# Patient Record
Sex: Female | Born: 1985 | Race: White | Hispanic: No | Smoking: Never smoker
Health system: Southern US, Community
[De-identification: ages and names within clinical notes are randomized; demographics above are authoritative.]

## PROBLEM LIST (undated history)

## (undated) DIAGNOSIS — Z789 Other specified health status: Secondary | ICD-10-CM

## (undated) HISTORY — PX: NO PAST SURGERIES: SHX2092

## (undated) HISTORY — DX: Other specified health status: Z78.9

---

## 2009-04-28 ENCOUNTER — Other Ambulatory Visit: Admission: RE | Admit: 2009-04-28 | Discharge: 2009-04-28 | Payer: Self-pay | Admitting: Obstetrics and Gynecology

## 2009-10-07 ENCOUNTER — Ambulatory Visit: Payer: Self-pay | Admitting: Advanced Practice Midwife

## 2009-10-07 ENCOUNTER — Inpatient Hospital Stay (HOSPITAL_COMMUNITY): Admission: AD | Admit: 2009-10-07 | Discharge: 2009-10-07 | Payer: Self-pay | Admitting: Obstetrics & Gynecology

## 2009-10-08 ENCOUNTER — Inpatient Hospital Stay (HOSPITAL_COMMUNITY): Admission: AD | Admit: 2009-10-08 | Discharge: 2009-10-11 | Payer: Self-pay | Admitting: Obstetrics & Gynecology

## 2009-10-08 ENCOUNTER — Ambulatory Visit: Payer: Self-pay | Admitting: Advanced Practice Midwife

## 2011-03-18 LAB — CBC
MCHC: 33.6 g/dL (ref 30.0–36.0)
MCHC: 33.6 g/dL (ref 30.0–36.0)
MCV: 91.3 fL (ref 78.0–100.0)
Platelets: 239 10*3/uL (ref 150–400)
Platelets: 299 10*3/uL (ref 150–400)
RBC: 3.66 MIL/uL — ABNORMAL LOW (ref 3.87–5.11)
RDW: 15.9 % — ABNORMAL HIGH (ref 11.5–15.5)

## 2011-03-18 LAB — URINE MICROSCOPIC-ADD ON

## 2011-03-18 LAB — COMPREHENSIVE METABOLIC PANEL
AST: 18 U/L (ref 0–37)
Albumin: 2.9 g/dL — ABNORMAL LOW (ref 3.5–5.2)
Calcium: 8.9 mg/dL (ref 8.4–10.5)
Creatinine, Ser: 0.56 mg/dL (ref 0.4–1.2)
GFR calc Af Amer: >60 mL/min (ref >60)
GFR calc non Af Amer: >60 mL/min (ref >60)

## 2011-03-18 LAB — RPR: RPR Ser Ql: NONREACTIVE

## 2011-03-18 LAB — URINALYSIS, ROUTINE W REFLEX MICROSCOPIC
Leukocytes, UA: NEGATIVE
Specific Gravity, Urine: <1.005 — ABNORMAL LOW (ref 1.005–1.030)
Urobilinogen, UA: 0.2 mg/dL (ref 0.0–1.0)

## 2011-08-31 ENCOUNTER — Other Ambulatory Visit: Payer: Self-pay | Admitting: Adult Health

## 2011-08-31 ENCOUNTER — Other Ambulatory Visit (HOSPITAL_COMMUNITY)
Admission: RE | Admit: 2011-08-31 | Discharge: 2011-08-31 | Disposition: A | Discharge status: Home / Self Care | Payer: Self-pay | Source: Ambulatory Visit | Attending: Obstetrics and Gynecology | Admitting: Obstetrics and Gynecology

## 2011-08-31 DIAGNOSIS — Z01419 Encounter for gynecological examination (general) (routine) without abnormal findings: Secondary | ICD-10-CM | POA: Insufficient documentation

## 2012-12-13 NOTE — L&D Delivery Note (Signed)
Delivery Note At 8:39 AM a viable female was delivered via  (Presentation: ;  ).  APGAR: crying at perineum, ; weight .   Placenta status: intact.  Cord: 3V with the following complications: .  Cord pH: NA  Anesthesia: Epidural  Episiotomy: None Lacerations: Periurethral, and left labial Suture Repair: 4.0 vicryl on SH Est. Blood Loss (mL): 300cc  Mom to postpartum.  Baby to Couplet care / Skin to Skin  NSVD over intact perineum with active management of third stage with pit and traction. Spontaneous delviery of intact placenta w/ 3v cord. Infant delivered to maternal abdomen. Small periurethral tear repaired with 4.0 vicryl on SH - horizontal suture x1. Red robin placed to protect urethra. Small left labial tear. Hemostatic. EBL 300. Counts correct.   Tawana Scale 11/25/2013, 8:57 AM

## 2013-05-24 ENCOUNTER — Encounter: Payer: Self-pay | Admitting: *Deleted

## 2013-05-25 ENCOUNTER — Other Ambulatory Visit (INDEPENDENT_AMBULATORY_CARE_PROVIDER_SITE_OTHER): Payer: BC Managed Care – PPO

## 2013-05-25 ENCOUNTER — Other Ambulatory Visit: Payer: Self-pay | Admitting: Obstetrics & Gynecology

## 2013-05-25 DIAGNOSIS — O3680X0 Pregnancy with inconclusive fetal viability, not applicable or unspecified: Secondary | ICD-10-CM

## 2013-06-04 ENCOUNTER — Other Ambulatory Visit: Payer: Self-pay | Admitting: Women's Health

## 2013-06-04 ENCOUNTER — Encounter: Payer: Self-pay | Admitting: Women's Health

## 2013-06-04 ENCOUNTER — Ambulatory Visit (INDEPENDENT_AMBULATORY_CARE_PROVIDER_SITE_OTHER): Payer: BC Managed Care – PPO | Admitting: Women's Health

## 2013-06-04 ENCOUNTER — Other Ambulatory Visit (HOSPITAL_COMMUNITY)
Admission: RE | Admit: 2013-06-04 | Discharge: 2013-06-04 | Disposition: A | Discharge status: Home / Self Care | Payer: Self-pay | Source: Ambulatory Visit | Attending: Obstetrics & Gynecology | Admitting: Obstetrics & Gynecology

## 2013-06-04 VITALS — BP 116/80 | Ht 65.0 in | Wt 169.5 lb

## 2013-06-04 DIAGNOSIS — Z3482 Encounter for supervision of other normal pregnancy, second trimester: Secondary | ICD-10-CM

## 2013-06-04 DIAGNOSIS — Z01419 Encounter for gynecological examination (general) (routine) without abnormal findings: Secondary | ICD-10-CM | POA: Insufficient documentation

## 2013-06-04 DIAGNOSIS — Z3480 Encounter for supervision of other normal pregnancy, unspecified trimester: Secondary | ICD-10-CM

## 2013-06-04 DIAGNOSIS — Z3481 Encounter for supervision of other normal pregnancy, first trimester: Secondary | ICD-10-CM

## 2013-06-04 DIAGNOSIS — Z113 Encounter for screening for infections with a predominantly sexual mode of transmission: Secondary | ICD-10-CM | POA: Insufficient documentation

## 2013-06-04 DIAGNOSIS — Z331 Pregnant state, incidental: Secondary | ICD-10-CM

## 2013-06-04 DIAGNOSIS — Z1389 Encounter for screening for other disorder: Secondary | ICD-10-CM

## 2013-06-04 LAB — HIV ANTIBODY (ROUTINE TESTING W REFLEX): HIV: NONREACTIVE

## 2013-06-04 LAB — CBC
HCT: 35 % — ABNORMAL LOW (ref 36.0–46.0)
MCV: 88.6 fL (ref 78.0–100.0)
Platelets: 343 10*3/uL (ref 150–400)
RBC: 3.95 MIL/uL (ref 3.87–5.11)
WBC: 5.5 10*3/uL (ref 4.0–10.5)

## 2013-06-04 LAB — POCT URINALYSIS DIPSTICK

## 2013-06-04 NOTE — Progress Notes (Signed)
  Subjective:    Kayla Fernandez is a 27 y.o. G43P1001 Caucasian female at [redacted]w[redacted]d by 13wk u/s, being seen today for her first obstetrical visit.  Her obstetrical history is significant for uncomplicated term SVD x 1.  Pregnancy history fully reviewed.   Patient reports occasional nausea, no vomiting. Denies vb, cramping, UTI s/s.   Filed Vitals:   06/04/13 1102  BP: 116/80  Weight: 169 lb 8 oz (76.885 kg)    HISTORY: OB History   Grav Para Term Preterm Abortions TAB SAB Ect Mult Living   2 1 1       1      # Outc Date GA Lbr Len/2nd Wgt Sex Del Anes PTL Lv   1 TRM 10/10 [redacted]w[redacted]d  6lb11oz(3.033kg) M SVD EPI  Yes   2 CUR              Past Medical History  Diagnosis Date  . Medical history non-contributory    Past Surgical History  Procedure Laterality Date  . No past surgeries     Family History  Problem Relation Age of Onset  . Stroke Other   . Birth defects Paternal Grandmother     ovarian      Exam    Pelvic Exam:    Perineum: Normal Perineum   Vulva: normal   Vagina:  normal mucosa, normal discharge, no palpable nodules   Uterus   FH u-2     Cervix: normal   Adnexa: Not palpable   Urinary: urethral meatus normal    System:     Skin: normal coloration and turgor, no rashes    Neurologic: oriented, normal mood   Extremities: normal strength, tone, and muscle mass   HEENT PERRLA   Mouth/Teeth mucous membranes moist   Cardiovascular: regular rate and rhythm   Respiratory:  appears well, vitals normal, no respiratory distress, acyanotic, normal RR   Abdomen: soft, non-tender    Thin prep pap smear obtained  FHR 140 via doppler   Assessment:    Pregnancy: G2P1001 There are no active problems to display for this patient.     [redacted]w[redacted]d G2P1001 New OB visit Nausea    Plan:     Initial labs drawn Continue prenatal vitamins Problem list reviewed and updated Reviewed n/v relief measures and warning s/s to report Reviewed recommended weight gain based  on pre-gravid BMI Encouraged well-balanced diet Genetic Screening discussed Quad Screen: ordered Cystic fibrosis screening discussed requested Ultrasound discussed; fetal survey: requested Follow up in 4 weeks for anatomy u/s and visit  Marge Duncans 06/04/2013 11:36 AM

## 2013-06-04 NOTE — Progress Notes (Signed)
Cramps in stomach last week. None since then. New OB packet given. Consents signed.

## 2013-06-04 NOTE — Patient Instructions (Signed)
Nausea & Vomiting  Have saltine crackers or pretzels by your bed and eat a few bites before you raise your head out of bed in the morning  Eat small frequent meals throughout the day instead of large meals  Drink plenty of fluids throughout the day to stay hydrated, just don't drink a lot of fluids with your meals.  This can make your stomach fill up faster making you feel sick  Do not brush your teeth right after you eat  Products with real ginger are good for nausea, like ginger ale and ginger hard candy Make sure it says made with real ginger!  Sucking on sour candy like lemon heads is also good for nausea  If your prenatal vitamins make you nauseated, take them at night so you will sleep through the nausea  If you feel like you need medicine for the nausea & vomiting please let us know  If you are unable to keep any fluids or food down please let us know    Pregnancy - Second Trimester The second trimester of pregnancy (3 to 6 months) is a period of rapid growth for you and your baby. At the end of the sixth month, your baby is about 9 inches long and weighs 1 1/2 pounds. You will begin to feel the baby move between 18 and 20 weeks of the pregnancy. This is called quickening. Weight gain is faster. A clear fluid (colostrum) may leak out of your breasts. You may feel small contractions of the womb (uterus). This is known as false labor or Braxton-Hicks contractions. This is like a practice for labor when the baby is ready to be born. Usually, the problems with morning sickness have usually passed by the end of your first trimester. Some women develop small dark blotches (called cholasma, mask of pregnancy) on their face that usually goes away after the baby is born. Exposure to the sun makes the blotches worse. Acne may also develop in some pregnant women and pregnant women who have acne, may find that it goes away. PRENATAL EXAMS  Blood work may continue to be done during prenatal exams.  These tests are done to check on your health and the probable health of your baby. Blood work is used to follow your blood levels (hemoglobin). Anemia (low hemoglobin) is common during pregnancy. Iron and vitamins are given to help prevent this. You will also be checked for diabetes between 24 and 28 weeks of the pregnancy. Some of the previous blood tests may be repeated.  The size of the uterus is measured during each visit. This is to make sure that the baby is continuing to grow properly according to the dates of the pregnancy.  Your blood pressure is checked every prenatal visit. This is to make sure you are not getting toxemia.  Your urine is checked to make sure you do not have an infection, diabetes or protein in the urine.  Your weight is checked often to make sure gains are happening at the suggested rate. This is to ensure that both you and your baby are growing normally.  Sometimes, an ultrasound is performed to confirm the proper growth and development of the baby. This is a test which bounces harmless sound waves off the baby so your caregiver can more accurately determine due dates. Sometimes, a test is done on the amniotic fluid surrounding the baby. This test is called an amniocentesis. The amniotic fluid is obtained by sticking a needle into the belly (abdomen).   This is done to check the chromosomes in instances where there is a concern about possible genetic problems with the baby. It is also sometimes done near the end of pregnancy if an early delivery is required. In this case, it is done to help make sure the baby's lungs are mature enough for the baby to live outside of the womb. CHANGES OCCURING IN THE SECOND TRIMESTER OF PREGNANCY Your body goes through many changes during pregnancy. They vary from person to person. Talk to your caregiver about changes you notice that you are concerned about.  During the second trimester, you will likely have an increase in your appetite. It  is normal to have cravings for certain foods. This varies from person to person and pregnancy to pregnancy.  Your lower abdomen will begin to bulge.  You may have to urinate more often because the uterus and baby are pressing on your bladder. It is also common to get more bladder infections during pregnancy. You can help this by drinking lots of fluids and emptying your bladder before and after intercourse.  You may begin to get stretch marks on your hips, abdomen, and breasts. These are normal changes in the body during pregnancy. There are no exercises or medicines to take that prevent this change.  You may begin to develop swollen and bulging veins (varicose veins) in your legs. Wearing support hose, elevating your feet for 15 minutes, 3 to 4 times a day and limiting salt in your diet helps lessen the problem.  Heartburn may develop as the uterus grows and pushes up against the stomach. Antacids recommended by your caregiver helps with this problem. Also, eating smaller meals 4 to 5 times a day helps.  Constipation can be treated with a stool softener or adding bulk to your diet. Drinking lots of fluids, and eating vegetables, fruits, and whole grains are helpful.  Exercising is also helpful. If you have been very active up until your pregnancy, most of these activities can be continued during your pregnancy. If you have been less active, it is helpful to start an exercise program such as walking.  Hemorrhoids may develop at the end of the second trimester. Warm sitz baths and hemorrhoid cream recommended by your caregiver helps hemorrhoid problems.  Backaches may develop during this time of your pregnancy. Avoid heavy lifting, wear low heal shoes, and practice good posture to help with backache problems.  Some pregnant women develop tingling and numbness of their hand and fingers because of swelling and tightening of ligaments in the wrist (carpel tunnel syndrome). This goes away after the  baby is born.  As your breasts enlarge, you may have to get a bigger bra. Get a comfortable, cotton, support bra. Do not get a nursing bra until the last month of the pregnancy if you will be nursing the baby.  You may get a dark line from your belly button to the pubic area called the linea nigra.  You may develop rosy cheeks because of increase blood flow to the face.  You may develop spider looking lines of the face, neck, arms, and chest. These go away after the baby is born. HOME CARE INSTRUCTIONS   It is extremely important to avoid all smoking, herbs, alcohol, and unprescribed drugs during your pregnancy. These chemicals affect the formation and growth of the baby. Avoid these chemicals throughout the pregnancy to ensure the delivery of a healthy infant.  Most of your home care instructions are the same as suggested   for the first trimester of your pregnancy. Keep your caregiver's appointments. Follow your caregiver's instructions regarding medicine use, exercise, and diet.  During pregnancy, you are providing food for you and your baby. Continue to eat regular, well-balanced meals. Choose foods such as meat, fish, milk and other low fat dairy products, vegetables, fruits, and whole-grain breads and cereals. Your caregiver will tell you of the ideal weight gain.  A physical sexual relationship may be continued up until near the end of pregnancy if there are no other problems. Problems could include early (premature) leaking of amniotic fluid from the membranes, vaginal bleeding, abdominal pain, or other medical or pregnancy problems.  Exercise regularly if there are no restrictions. Check with your caregiver if you are unsure of the safety of some of your exercises. The greatest weight gain will occur in the last 2 trimesters of pregnancy. Exercise will help you:  Control your weight.  Get you in shape for labor and delivery.  Lose weight after you have the baby.  Wear a good support  or jogging bra for breast tenderness during pregnancy. This may help if worn during sleep. Pads or tissues may be used in the bra if you are leaking colostrum.  Do not use hot tubs, steam rooms or saunas throughout the pregnancy.  Wear your seat belt at all times when driving. This protects you and your baby if you are in an accident.  Avoid raw meat, uncooked cheese, cat litter boxes, and soil used by cats. These carry germs that can cause birth defects in the baby.  The second trimester is also a good time to visit your dentist for your dental health if this has not been done yet. Getting your teeth cleaned is okay. Use a soft toothbrush. Brush gently during pregnancy.  It is easier to leak urine during pregnancy. Tightening up and strengthening the pelvic muscles will help with this problem. Practice stopping your urination while you are going to the bathroom. These are the same muscles you need to strengthen. It is also the muscles you would use as if you were trying to stop from passing gas. You can practice tightening these muscles up 10 times a set and repeating this about 3 times per day. Once you know what muscles to tighten up, do not perform these exercises during urination. It is more likely to contribute to an infection by backing up the urine.  Ask for help if you have financial, counseling, or nutritional needs during pregnancy. Your caregiver will be able to offer counseling for these needs as well as refer you for other special needs.  Your skin may become oily. If so, wash your face with mild soap, use non-greasy moisturizer and oil or cream based makeup. MEDICINES AND DRUG USE IN PREGNANCY  Take prenatal vitamins as directed. The vitamin should contain 1 milligram of folic acid. Keep all vitamins out of reach of children. Only a couple vitamins or tablets containing iron may be fatal to a baby or young child when ingested.  Avoid use of all medicines, including herbs,  over-the-counter medicines, not prescribed or suggested by your caregiver. Only take over-the-counter or prescription medicines for pain, discomfort, or fever as directed by your caregiver. Do not use aspirin.  Let your caregiver also know about herbs you may be using.  Alcohol is related to a number of birth defects. This includes fetal alcohol syndrome. All alcohol, in any form, should be avoided completely. Smoking will cause low birth rate and   premature babies.  Street or illegal drugs are very harmful to the baby. They are absolutely forbidden. A baby born to an addicted mother will be addicted at birth. The baby will go through the same withdrawal an adult does. SEEK MEDICAL CARE IF:  You have any concerns or worries during your pregnancy. It is better to call with your questions if you feel they cannot wait, rather than worry about them. SEEK IMMEDIATE MEDICAL CARE IF:   An unexplained oral temperature above 102 F (38.9 C) develops, or as your caregiver suggests.  You have leaking of fluid from the vagina (birth canal). If leaking membranes are suspected, take your temperature and tell your caregiver of this when you call.  There is vaginal spotting, bleeding, or passing clots. Tell your caregiver of the amount and how many pads are used. Light spotting in pregnancy is common, especially following intercourse.  You develop a bad smelling vaginal discharge with a change in the color from clear to white.  You continue to feel sick to your stomach (nauseated) and have no relief from remedies suggested. You vomit blood or coffee ground-like materials.  You lose more than 2 pounds of weight or gain more than 2 pounds of weight over 1 week, or as suggested by your caregiver.  You notice swelling of your face, hands, feet, or legs.  You get exposed to German measles and have never had them.  You are exposed to fifth disease or chickenpox.  You develop belly (abdominal) pain. Round  ligament discomfort is a common non-cancerous (benign) cause of abdominal pain in pregnancy. Your caregiver still must evaluate you.  You develop a bad headache that does not go away.  You develop fever, diarrhea, pain with urination, or shortness of breath.  You develop visual problems, blurry, or double vision.  You fall or are in a car accident or any kind of trauma.  There is mental or physical violence at home. Document Released: 11/23/2001 Document Revised: 08/23/2012 Document Reviewed: 05/28/2009 [employer] Patient Information 2014 [employer].  

## 2013-06-05 LAB — AFP, QUAD SCREEN
AFP: 32.1 IU/mL
Curr Gest Age: 15.1 wks.days
Interpretation-AFP: NEGATIVE
Osb Risk: 1:5390 {titer}
uE3 Mom: 1.49
uE3 Value: 0.6 ng/mL

## 2013-06-05 LAB — VARICELLA ZOSTER ANTIBODY, IGG: Varicella IgG: 528.4 Index — ABNORMAL HIGH (ref <135.00)

## 2013-06-05 LAB — ANTIBODY SCREEN: Antibody Screen: NEGATIVE

## 2013-06-05 LAB — URINALYSIS
Glucose, UA: NEGATIVE mg/dL
Specific Gravity, Urine: 1.026 (ref 1.005–1.030)
pH: 6 (ref 5.0–8.0)

## 2013-06-05 LAB — DRUG SCREEN, URINE, NO CONFIRMATION
Cocaine Metabolites: NEGATIVE
Creatinine,U: 275.4 mg/dL
Opiate Screen, Urine: NEGATIVE
Phencyclidine (PCP): NEGATIVE

## 2013-06-05 LAB — URINE CULTURE
Colony Count: NO GROWTH
Organism ID, Bacteria: NO GROWTH

## 2013-06-06 ENCOUNTER — Encounter: Payer: Self-pay | Admitting: Women's Health

## 2013-06-06 LAB — CYSTIC FIBROSIS DIAGNOSTIC STUDY

## 2013-06-09 ENCOUNTER — Encounter: Payer: Self-pay | Admitting: Women's Health

## 2013-07-02 ENCOUNTER — Encounter: Payer: Self-pay | Admitting: Women's Health

## 2013-07-02 ENCOUNTER — Other Ambulatory Visit: Payer: Self-pay | Admitting: Obstetrics & Gynecology

## 2013-07-02 ENCOUNTER — Ambulatory Visit (INDEPENDENT_AMBULATORY_CARE_PROVIDER_SITE_OTHER): Payer: BC Managed Care – PPO | Admitting: Women's Health

## 2013-07-02 ENCOUNTER — Ambulatory Visit (INDEPENDENT_AMBULATORY_CARE_PROVIDER_SITE_OTHER): Payer: BC Managed Care – PPO

## 2013-07-02 VITALS — BP 120/78 | Wt 171.0 lb

## 2013-07-02 DIAGNOSIS — Z3482 Encounter for supervision of other normal pregnancy, second trimester: Secondary | ICD-10-CM

## 2013-07-02 DIAGNOSIS — Z1389 Encounter for screening for other disorder: Secondary | ICD-10-CM

## 2013-07-02 DIAGNOSIS — Z3480 Encounter for supervision of other normal pregnancy, unspecified trimester: Secondary | ICD-10-CM

## 2013-07-02 DIAGNOSIS — Z331 Pregnant state, incidental: Secondary | ICD-10-CM

## 2013-07-02 LAB — POCT URINALYSIS DIPSTICK
Blood, UA: NEGATIVE
Glucose, UA: NEGATIVE
Ketones, UA: NEGATIVE

## 2013-07-02 NOTE — Progress Notes (Signed)
Reports flutters. Denies uc's, lof, vb, urinary frequency, urgency, hesitancy, or dysuria.  Heartburn, hasn't tried any OTC measures, to try TUMS.  Reviewed warning s/s to report.  All questions answered. F/U in 4wks for visit.

## 2013-07-02 NOTE — Progress Notes (Signed)
Having heartburn. Recommended TUMS.

## 2013-07-02 NOTE — Patient Instructions (Signed)
Sterilization Information, Female Female sterilization is a procedure to permanently prevent pregnancy. There are different ways to perform sterilization, but all either block or close the fallopian tubes so that your eggs cannot reach your uterus. If your egg cannot reach your uterus, sperm cannot fertilize the egg, and you cannot get pregnant.  Sterilization is performed by a surgical procedure. Sometimes these procedures are performed in a hospital while a patient is asleep. Sometimes they can be done in a clinic setting with the patient awake. The fallopian tubes can be surgically cut, tied, or sealed through a procedure called tubal ligation. The fallopian tubes can also be closed with clips or rings. Sterilization can also be done by placing a tiny coil into each fallopian tube, which causes scar tissue to grow inside the tube. The scar tissue then blocks the tubes.  Discuss sterilization with your caregiver to answer any concerns you or your partner may have. You may want to ask what type of sterilization your caregiver performs. Some caregivers may not perform all the various options. Sterilization is permanent and should only be done if you are sure you do not want children or do not want any more children. Having a sterilization reversed may not be successful.  STERILIZATION PROCEDURES  Laparoscopic sterilization. This is a surgical method performed at a time other than right after childbirth. Two incisions are made in the lower abdomen. A thin, lighted tube (laparoscope) is inserted into one of the incisions and is used to perform the procedure. The fallopian tubes are closed with a ring or a clip. An instrument that uses heat could be used to seal the tubes closed (electrocautery).   Mini-laparotomy. This is a surgical method done 1 or 2 days after giving birth. Typically, a small incision is made just below the belly button (umbilicus) and the fallopian tubes are exposed. The tubes can then be  sealed, tied, or cut.   Hysteroscopic sterilization. This is performed at a time other than right after childbirth. A tiny, spring-like coil is inserted through the cervix and uterus and placed into the fallopian tubes. The coil causes scaring and blocks the tubes. Other forms of contraception should be used for 3 months after the procedure to allow the scar tissue to form completely. Additionally, it is required hysterosalpingography be done 3 months later to ensure that the procedure was successful. Hysterosalpingography is a procedure that uses X-rays to look at your uterus and fallopian tubes after a material to make them show up better has been inserted. IS STERILIZATION SAFE? Sterilization is considered safe with very rare complications. Risks depend on the type of procedure you have. As with any surgical procedure, there are risks. Some risks of sterilization by any means include:   Bleeding.  Infection.  Reaction to anesthesia medicine.  Injury to surrounding organs. Risks specific to having hysteroscopic coils placed include:  The coils may not be placed correctly the first time.   The coils may move out of place.   The tubes may not get completely blocked after 3 months.   Injury to surrounding organs when placing the coil.  HOW EFFECTIVE IS FEMALE STERILIZATION? Sterilization is nearly 100% effective, but it can fail. Depending on the type of sterilization, the rate of failure can be as high as 3%. After hysteroscopic sterilization with placement of fallopian tube coils, you will need back-up birth control for 3 months after the procedure. Sterilization is effective for a lifetime.  BENEFITS OF STERILIZATION  It does   not affect your hormones, and therefore will not affect your menstrual periods, sexual desire, or performance.   It is effective for a lifetime.   It is safe.   You do not need to worry about getting pregnant. Keep in mind that if you had the  hysteroscopic placement procedure, you must wait 3 months after the procedure (or until your caregiver confirms) before pregnancy is not considered possible.   There are no side effects unlike other types of birth control (contraception).  DRAWBACKS OF STERILIZATION  You must be sure you do not want children or any more children. The procedure is permanent.   It does not provide protection against sexually transmitted infections (STIs).   The tubes can grow back together. If this happens, there is a risk of pregnancy. There is also an increased risk (50%) of pregnancy being an ectopic pregnancy. This is a pregnancy that happens outside of the uterus. Document Released: 05/17/2008 Document Revised: 05/30/2012 Document Reviewed: 03/16/2012 [employer] Patient Information 2014 [employer].  

## 2013-07-02 NOTE — Progress Notes (Signed)
Anat Screen complete, all anat appears nml, meas C/W dates, 18+5 weeks, EDC 01/26/2013, cx closed=4.7 cm, bilat ovs seen, fluid nml, active female fetus, FHR 143 bpm, post. Plac.vertex lie

## 2013-07-03 ENCOUNTER — Telehealth: Payer: Self-pay | Admitting: Women's Health

## 2013-07-03 NOTE — Telephone Encounter (Signed)
Spoke with pt. Advised to watch for any symptoms, blisters, and let us know if she gets any. Advised there is a blood test that can be done. Pt voiced understanding. JSY

## 2013-07-30 ENCOUNTER — Encounter: Payer: Self-pay | Admitting: Women's Health

## 2013-07-30 ENCOUNTER — Ambulatory Visit (INDEPENDENT_AMBULATORY_CARE_PROVIDER_SITE_OTHER): Payer: BC Managed Care – PPO | Admitting: Obstetrics and Gynecology

## 2013-07-30 VITALS — BP 112/82 | Wt 176.5 lb

## 2013-07-30 DIAGNOSIS — Z3480 Encounter for supervision of other normal pregnancy, unspecified trimester: Secondary | ICD-10-CM

## 2013-07-30 DIAGNOSIS — Z3482 Encounter for supervision of other normal pregnancy, second trimester: Secondary | ICD-10-CM

## 2013-07-30 DIAGNOSIS — Z1389 Encounter for screening for other disorder: Secondary | ICD-10-CM

## 2013-07-30 DIAGNOSIS — Z331 Pregnant state, incidental: Secondary | ICD-10-CM

## 2013-07-30 LAB — POCT URINALYSIS DIPSTICK
Glucose, UA: NEGATIVE
Ketones, UA: NEGATIVE

## 2013-07-30 NOTE — Progress Notes (Signed)
Heartburn improved, no medical tx required. Good FM, NO c/o

## 2013-08-27 ENCOUNTER — Ambulatory Visit (INDEPENDENT_AMBULATORY_CARE_PROVIDER_SITE_OTHER): Payer: BC Managed Care – PPO | Admitting: Women's Health

## 2013-08-27 ENCOUNTER — Other Ambulatory Visit: Payer: BC Managed Care – PPO

## 2013-08-27 ENCOUNTER — Encounter: Payer: Self-pay | Admitting: Women's Health

## 2013-08-27 VITALS — BP 100/70 | Wt 178.0 lb

## 2013-08-27 DIAGNOSIS — Z331 Pregnant state, incidental: Secondary | ICD-10-CM

## 2013-08-27 DIAGNOSIS — Z3483 Encounter for supervision of other normal pregnancy, third trimester: Secondary | ICD-10-CM

## 2013-08-27 DIAGNOSIS — Z3480 Encounter for supervision of other normal pregnancy, unspecified trimester: Secondary | ICD-10-CM

## 2013-08-27 DIAGNOSIS — Z1389 Encounter for screening for other disorder: Secondary | ICD-10-CM

## 2013-08-27 LAB — CBC
HCT: 35.2 % — ABNORMAL LOW (ref 36.0–46.0)
MCV: 92.9 fL (ref 78.0–100.0)
Platelets: 334 10*3/uL (ref 150–400)
RBC: 3.79 MIL/uL — ABNORMAL LOW (ref 3.87–5.11)
WBC: 7.4 10*3/uL (ref 4.0–10.5)

## 2013-08-27 LAB — POCT URINALYSIS DIPSTICK
Blood, UA: NEGATIVE
Nitrite, UA: NEGATIVE

## 2013-08-27 LAB — RPR

## 2013-08-27 NOTE — Patient Instructions (Signed)

## 2013-08-27 NOTE — Progress Notes (Signed)
Reports good fm. Denies uc's, lof, vb, urinary frequency, urgency, hesitancy, or dysuria.  No complaints.  Reviewed ptl s/s, fkc.  All questions answered. PN2 today. F/U in 4wks for visit.

## 2013-08-27 NOTE — Progress Notes (Signed)
For PN2 Today. 

## 2013-08-28 ENCOUNTER — Telehealth: Payer: Self-pay | Admitting: Obstetrics and Gynecology

## 2013-08-28 ENCOUNTER — Encounter: Payer: Self-pay | Admitting: Women's Health

## 2013-08-28 LAB — GLUCOSE TOLERANCE, 2 HOURS W/ 1HR
Glucose, 1 hour: 102 mg/dL (ref 70–170)
Glucose, Fasting: 67 mg/dL — ABNORMAL LOW (ref 70–99)

## 2013-08-28 LAB — ANTIBODY SCREEN: Antibody Screen: NEGATIVE

## 2013-08-28 LAB — HSV 2 ANTIBODY, IGG: HSV 2 Glycoprotein G Ab, IgG: <0.1 IV

## 2013-08-28 NOTE — Telephone Encounter (Signed)
Pt states noticed clear, thick discharge today. No bleeding, odor, irritation,or cramping, +FM. Pt states was seen in our office yesterday. Per Joellyn Haff, CNM everything WNL. Pt to monitor discharge if odor, irritation, or watery discharge, vaginal bleeding, or decrease FM pt to call our office back. Pt verbalized understanding.

## 2013-08-30 ENCOUNTER — Telehealth: Payer: Self-pay | Admitting: Obstetrics & Gynecology

## 2013-08-30 NOTE — Telephone Encounter (Signed)
Pt states concerned discharge is amniotic fluid, states at times discharge is thick and other times it is watery. Pt states +FM, no vaginal bleeding. Pt states going out of town this weekend and would like to be seen. Call transferred to front staff for an appt to be made.

## 2013-08-31 ENCOUNTER — Ambulatory Visit (INDEPENDENT_AMBULATORY_CARE_PROVIDER_SITE_OTHER): Payer: BC Managed Care – PPO | Admitting: Obstetrics and Gynecology

## 2013-08-31 ENCOUNTER — Encounter: Payer: Self-pay | Admitting: Obstetrics and Gynecology

## 2013-08-31 VITALS — BP 110/70 | Wt 179.0 lb

## 2013-08-31 DIAGNOSIS — Z3483 Encounter for supervision of other normal pregnancy, third trimester: Secondary | ICD-10-CM

## 2013-08-31 DIAGNOSIS — Z1389 Encounter for screening for other disorder: Secondary | ICD-10-CM

## 2013-08-31 DIAGNOSIS — O2390 Unspecified genitourinary tract infection in pregnancy, unspecified trimester: Secondary | ICD-10-CM

## 2013-08-31 DIAGNOSIS — N898 Other specified noninflammatory disorders of vagina: Secondary | ICD-10-CM

## 2013-08-31 DIAGNOSIS — O99019 Anemia complicating pregnancy, unspecified trimester: Secondary | ICD-10-CM

## 2013-08-31 LAB — POCT URINALYSIS DIPSTICK
Blood, UA: NEGATIVE
Glucose, UA: NEGATIVE
Ketones, UA: NEGATIVE
Leukocytes, UA: NEGATIVE
Nitrite, UA: NEGATIVE
Protein, UA: NEGATIVE

## 2013-08-31 NOTE — Patient Instructions (Addendum)
Continue routine care Preterm Labor Preterm labor is when labor starts at less than 37 weeks of pregnancy. The normal length of a pregnancy is 39 to 41 weeks. CAUSES Often, there is no identifiable underlying cause as to why a woman goes into preterm labor. However, one of the most common known causes of preterm labor is infection. Infections of the uterus, cervix, vagina, amniotic sac, bladder, kidney, or even the lungs (pneumonia) can cause labor to start. Other causes of preterm labor include:  Urogenital infections, such as yeast infections and bacterial vaginosis.  Uterine abnormalities (uterine shape, uterine septum, fibroids, bleeding from the placenta).  A cervix that has been operated on and opens prematurely.  Malformations in the baby.  Multiple gestations (twins, triplets, and so on).  Breakage of the amniotic sac. Additional risk factors for preterm labor include:  Previous history of preterm labor.  Premature rupture of membranes (PROM).  A placenta that covers the opening of the cervix (placenta previa).  A placenta that separates from the uterus (placenta abruption).  A cervix that is too weak to hold the baby in the uterus (incompetence cervix).  Having too much fluid in the amniotic sac (polyhydramnios).  Taking illegal drugs or smoking while pregnant.  Not gaining enough weight while pregnant.  Women younger than 76 and older than 27 years old.  Low socioeconomic status.  African-American ethnicity. SYMPTOMS Signs and symptoms of preterm labor include:  Menstrual-like cramps.  Contractions that are 30 to 70 seconds apart, become very regular, closer together, and are more intense and painful.  Contractions that start on the top of the uterus and spread down to the lower abdomen and back.  A sense of increased pelvic pressure or back pain.  A watery or bloody discharge that comes from the vagina. DIAGNOSIS  A diagnosis can be confirmed by:  A  vaginal exam.  An ultrasound of the cervix.  Sampling (swabbing) cervico-vaginal secretions. These samples can be tested for the presence of fetal fibronectin. This is a protein found in cervical discharge which is associated with preterm labor.  Fetal monitoring. TREATMENT  Depending on the length of the pregnancy and other circumstances, a caregiver may suggest bed rest. If necessary, there are medicines that can be given to stop contractions and to quicken fetal lung maturity. If labor happens before 34 weeks of pregnancy, a prolonged hospital stay may be recommended. Treatment depends on the condition of both the mother and baby. PREVENTION There are some things a mother can do to lower the risk of preterm labor in future pregnancies. A woman can:   Stop smoking.  Maintain healthy weight gain and avoid chemicals and drugs that are not necessary.  Be watchful for any type of infection.  Inform her caregiver if she has a known history of preterm labor. Document Released: 02/19/2004 Document Revised: 02/21/2012 Document Reviewed: 03/26/2011 [facility] Patient Information 2014 [place], [place].

## 2013-08-31 NOTE — Progress Notes (Signed)
Discharge appears physiologic wet prep negative

## 2013-08-31 NOTE — Progress Notes (Signed)
Pt here today for ? Leaking of fluid. Pt states she has an abnormal fluid that comes and goes during the day, sometimes the discharge is thick and others times its not. No other problems or concerns at this time.

## 2013-09-17 ENCOUNTER — Ambulatory Visit (INDEPENDENT_AMBULATORY_CARE_PROVIDER_SITE_OTHER): Payer: BC Managed Care – PPO | Admitting: Obstetrics and Gynecology

## 2013-09-17 VITALS — BP 120/68 | Wt 180.2 lb

## 2013-09-17 DIAGNOSIS — Z331 Pregnant state, incidental: Secondary | ICD-10-CM

## 2013-09-17 DIAGNOSIS — Z3483 Encounter for supervision of other normal pregnancy, third trimester: Secondary | ICD-10-CM

## 2013-09-17 DIAGNOSIS — Z3480 Encounter for supervision of other normal pregnancy, unspecified trimester: Secondary | ICD-10-CM

## 2013-09-17 DIAGNOSIS — Z1389 Encounter for screening for other disorder: Secondary | ICD-10-CM

## 2013-09-17 LAB — POCT URINALYSIS DIPSTICK
Blood, UA: NEGATIVE
Nitrite, UA: NEGATIVE

## 2013-09-17 NOTE — Patient Instructions (Addendum)
Heartburn During Pregnancy  Heartburn is a burning sensation in the chest caused by stomach acid backing up into the esophagus. Heartburn (also known as "reflux") is common in pregnancy because a certain hormone (progesterone) changes. The progesterone hormone may relax the valve that separates the esophagus from the stomach. This allows acid to go up into the esophagus, causing heartburn. Heartburn may also happen in pregnancy because the enlarging uterus pushes up on the stomach, which pushes more acid into the esophagus. This is especially true in the later stages of pregnancy. Heartburn problems usually go away after giving birth. CAUSES   The progesterone hormone.  Changing hormone levels.  The growing uterus that pushes stomach acid upward.  Large meals.  Certain foods and drinks.  Exercise.  Increased acid production. SYMPTOMS   Burning pain in the chest or lower throat.  Bitter taste in the mouth.  Coughing. DIAGNOSIS  Heartburn is typically diagnosed by your caregiver when taking a careful history of your concern. Your caregiver may order a blood test to check for a certain type of bacteria that is associated with heartburn. Sometimes, heartburn is diagnosed by prescribing a heartburn medicine to see if the symptoms improve. It is rare in pregnancy to have a procedure called an endoscopy. This is when a tube with a light and a camera on the end is used to examine the esophagus and the stomach. TREATMENT   Your caregiver may tell you to use certain over-the-counter medicines (antacids, acid reducers) for mild heartburn.  Your caregiver may prescribe medicines to decrease stomach acid or to protect your stomach lining.  Your caregiver may recommend certain diet changes.  For severe cases, your caregiver may recommend that the head of the bed be elevated on blocks. (Sleeping with more pillows is not an effective treatment as it only changes the position of your head and does  not improve the main problem of stomach acid refluxing into the esophagus.) HOME CARE INSTRUCTIONS   Take all medicines as directed by your caregiver.  Raise the head of your bed by putting blocks under the legs if instructed to by your caregiver.  Do not exercise right after eating.  Avoid eating 2 or 3 hours before bed. Do not lie down right after eating.  Eat small meals throughout the day instead of 3 large meals.  Identify foods and beverages that make your symptoms worse and avoid them. Foods you may want to avoid include:  Peppers.  Chocolate.  High-fat foods, including fried foods.  Spicy foods.  Garlic and onions.  Citrus fruits, including oranges, grapefruit, lemons, and limes.  Food containing tomatoes or tomato products.  Mint.  Carbonated and caffeinated drinks.  Vinegar. SEEK IMMEDIATE MEDICAL CARE IF:   You have severe chest pain that goes down your arm or into your jaw or neck.  You feel sweaty, dizzy, or lightheaded.  You become short of breath.  You vomit blood.  You have difficulty or pain with swallowing.  You have bloody or black, tarry stools.  You have episodes of heartburn more than 3 times a week, for more than 2 weeks. MAKE SURE YOU:  Understand these instructions.  Will watch your condition.  Will get help right away if you are not doing well or get worse. Document Released: 11/26/2000 Document Revised: 02/21/2012 Document Reviewed: 05/20/2011 [facility] Patient Information 2014 [place], [place]. Fetal Movement Counts Patient Name: __________________________________________________ Patient Due Date: ____________________ Performing a fetal movement count is highly recommended in high-risk pregnancies, but  it is good for every pregnant woman to do. Your caregiver may ask you to start counting fetal movements at 28 weeks of the pregnancy. Fetal movements often increase:  After eating a full meal.  After physical activity.  After  eating or drinking something sweet or cold.  At rest. Pay attention to when you feel the baby is most active. This will help you notice a pattern of your baby's sleep and wake cycles and what factors contribute to an increase in fetal movement. It is important to perform a fetal movement count at the same time each day when your baby is normally most active.  HOW TO COUNT FETAL MOVEMENTS 1. Find a quiet and comfortable area to sit or lie down on your left side. Lying on your left side provides the best blood and oxygen circulation to your baby. 2. Write down the day and time on a sheet of paper or in a journal. 3. Start counting kicks, flutters, swishes, rolls, or jabs in a 2 hour period. You should feel at least 10 movements within 2 hours. 4. If you do not feel 10 movements in 2 hours, wait 2 3 hours and count again. Look for a change in the pattern or not enough counts in 2 hours. SEEK MEDICAL CARE IF:  You feel less than 10 counts in 2 hours, tried twice.  There is no movement in over an hour.  The pattern is changing or taking longer each day to reach 10 counts in 2 hours.  You feel the baby is not moving as he or she usually does. Date: ____________ Movements: ____________ Start time: ____________ Kayla Fernandez time: ____________  Date: ____________ Movements: ____________ Start time: ____________ Kayla Fernandez time: ____________ Date: ____________ Movements: ____________ Start time: ____________ Kayla Fernandez time: ____________ Date: ____________ Movements: ____________ Start time: ____________ Kayla Fernandez time: ____________ Date: ____________ Movements: ____________ Start time: ____________ Kayla Fernandez time: ____________ Date: ____________ Movements: ____________ Start time: ____________ Kayla Fernandez time: ____________ Date: ____________ Movements: ____________ Start time: ____________ Kayla Fernandez time: ____________ Date: ____________ Movements: ____________ Start time: ____________ Kayla Fernandez time: ____________  Date:  ____________ Movements: ____________ Start time: ____________ Kayla Fernandez time: ____________ Date: ____________ Movements: ____________ Start time: ____________ Kayla Fernandez time: ____________ Date: ____________ Movements: ____________ Start time: ____________ Kayla Fernandez time: ____________ Date: ____________ Movements: ____________ Start time: ____________ Kayla Fernandez time: ____________ Date: ____________ Movements: ____________ Start time: ____________ Kayla Fernandez time: ____________ Date: ____________ Movements: ____________ Start time: ____________ Kayla Fernandez time: ____________ Date: ____________ Movements: ____________ Start time: ____________ Kayla Fernandez time: ____________  Date: ____________ Movements: ____________ Start time: ____________ Kayla Fernandez time: ____________ Date: ____________ Movements: ____________ Start time: ____________ Kayla Fernandez time: ____________ Date: ____________ Movements: ____________ Start time: ____________ Kayla Fernandez time: ____________ Date: ____________ Movements: ____________ Start time: ____________ Kayla Fernandez time: ____________ Date: ____________ Movements: ____________ Start time: ____________ Kayla Fernandez time: ____________ Date: ____________ Movements: ____________ Start time: ____________ Kayla Fernandez time: ____________ Date: ____________ Movements: ____________ Start time: ____________ Kayla Fernandez time: ____________  Date: ____________ Movements: ____________ Start time: ____________ Kayla Fernandez time: ____________ Date: ____________ Movements: ____________ Start time: ____________ Kayla Fernandez time: ____________ Date: ____________ Movements: ____________ Start time: ____________ Kayla Fernandez time: ____________ Date: ____________ Movements: ____________ Start time: ____________ Kayla Fernandez time: ____________ Date: ____________ Movements: ____________ Start time: ____________ Kayla Fernandez time: ____________ Date: ____________ Movements: ____________ Start time: ____________ Kayla Fernandez time: ____________ Date: ____________ Movements: ____________ Start  time: ____________ Kayla Fernandez time: ____________  Date: ____________ Movements: ____________ Start time: ____________ Kayla Fernandez time: ____________ Date: ____________ Movements: ____________ Start time: ____________ Kayla Fernandez time: ____________ Date: ____________ Movements: ____________ Start time: ____________ Kayla Fernandez time:  ____________ Date: ____________ Movements: ____________ Start time: ____________ Kayla Fernandez time: ____________ Date: ____________ Movements: ____________ Start time: ____________ Kayla Fernandez time: ____________ Date: ____________ Movements: ____________ Start time: ____________ Kayla Fernandez time: ____________ Date: ____________ Movements: ____________ Start time: ____________ Kayla Fernandez time: ____________  Date: ____________ Movements: ____________ Start time: ____________ Kayla Fernandez time: ____________ Date: ____________ Movements: ____________ Start time: ____________ Kayla Fernandez time: ____________ Date: ____________ Movements: ____________ Start time: ____________ Kayla Fernandez time: ____________ Date: ____________ Movements: ____________ Start time: ____________ Kayla Fernandez time: ____________ Date: ____________ Movements: ____________ Start time: ____________ Kayla Fernandez time: ____________ Date: ____________ Movements: ____________ Start time: ____________ Kayla Fernandez time: ____________ Date: ____________ Movements: ____________ Start time: ____________ Kayla Fernandez time: ____________  Date: ____________ Movements: ____________ Start time: ____________ Kayla Fernandez time: ____________ Date: ____________ Movements: ____________ Start time: ____________ Kayla Fernandez time: ____________ Date: ____________ Movements: ____________ Start time: ____________ Kayla Fernandez time: ____________ Date: ____________ Movements: ____________ Start time: ____________ Kayla Fernandez time: ____________ Date: ____________ Movements: ____________ Start time: ____________ Kayla Fernandez time: ____________ Date: ____________ Movements: ____________ Start time: ____________ Kayla Fernandez time:  ____________ Date: ____________ Movements: ____________ Start time: ____________ Kayla Fernandez time: ____________  Date: ____________ Movements: ____________ Start time: ____________ Kayla Fernandez time: ____________ Date: ____________ Movements: ____________ Start time: ____________ Kayla Fernandez time: ____________ Date: ____________ Movements: ____________ Start time: ____________ Kayla Fernandez time: ____________ Date: ____________ Movements: ____________ Start time: ____________ Kayla Fernandez time: ____________ Date: ____________ Movements: ____________ Start time: ____________ Kayla Fernandez time: ____________ Date: ____________ Movements: ____________ Start time: ____________ Kayla Fernandez time: ____________ Document Released: 12/29/2006 Document Revised: 11/15/2012 Document Reviewed: 09/25/2012 [employer] Patient Information 2014 [employer].

## 2013-09-17 NOTE — Progress Notes (Signed)
No complaints at this time.   Good fm, no bleeding or contractions., no pt concerns

## 2013-09-27 ENCOUNTER — Other Ambulatory Visit: Payer: BC Managed Care – PPO | Admitting: Obstetrics & Gynecology

## 2013-09-27 ENCOUNTER — Other Ambulatory Visit: Payer: BC Managed Care – PPO

## 2013-10-01 ENCOUNTER — Encounter: Payer: Self-pay | Admitting: Women's Health

## 2013-10-01 ENCOUNTER — Ambulatory Visit (INDEPENDENT_AMBULATORY_CARE_PROVIDER_SITE_OTHER): Payer: BC Managed Care – PPO | Admitting: Women's Health

## 2013-10-01 VITALS — BP 112/80 | Wt 180.5 lb

## 2013-10-01 DIAGNOSIS — Z23 Encounter for immunization: Secondary | ICD-10-CM

## 2013-10-01 DIAGNOSIS — Z3483 Encounter for supervision of other normal pregnancy, third trimester: Secondary | ICD-10-CM

## 2013-10-01 DIAGNOSIS — O99019 Anemia complicating pregnancy, unspecified trimester: Secondary | ICD-10-CM

## 2013-10-01 DIAGNOSIS — Z1389 Encounter for screening for other disorder: Secondary | ICD-10-CM

## 2013-10-01 DIAGNOSIS — Z331 Pregnant state, incidental: Secondary | ICD-10-CM

## 2013-10-01 LAB — POCT URINALYSIS DIPSTICK
Blood, UA: NEGATIVE
Glucose, UA: NEGATIVE
Ketones, UA: NEGATIVE
Nitrite, UA: NEGATIVE

## 2013-10-01 MED ORDER — INFLUENZA VAC SPLIT QUAD 0.5 ML IM SUSP
0.5000 mL | Freq: Once | INTRAMUSCULAR | Status: AC
  Administered 2013-10-01: 0.5 mL via INTRAMUSCULAR

## 2013-10-01 NOTE — Patient Instructions (Signed)

## 2013-10-01 NOTE — Progress Notes (Signed)
Reports good fm. Denies uc's, lof, vb, urinary frequency, urgency, hesitancy, or dysuria.  Some pressure yest when walking, none since.  Reviewed pn2 results, ptl s/s, fkc.  All questions answered. F/U in 2wks for visit.

## 2013-10-15 ENCOUNTER — Encounter: Payer: Self-pay | Admitting: Women's Health

## 2013-10-15 ENCOUNTER — Ambulatory Visit (INDEPENDENT_AMBULATORY_CARE_PROVIDER_SITE_OTHER): Payer: BC Managed Care – PPO | Admitting: Women's Health

## 2013-10-15 VITALS — BP 100/60 | Wt 182.0 lb

## 2013-10-15 DIAGNOSIS — O1213 Gestational proteinuria, third trimester: Secondary | ICD-10-CM

## 2013-10-15 DIAGNOSIS — Z1389 Encounter for screening for other disorder: Secondary | ICD-10-CM

## 2013-10-15 DIAGNOSIS — O99019 Anemia complicating pregnancy, unspecified trimester: Secondary | ICD-10-CM

## 2013-10-15 DIAGNOSIS — Z3483 Encounter for supervision of other normal pregnancy, third trimester: Secondary | ICD-10-CM

## 2013-10-15 DIAGNOSIS — Z331 Pregnant state, incidental: Secondary | ICD-10-CM

## 2013-10-15 LAB — POCT URINALYSIS DIPSTICK
Blood, UA: NEGATIVE
Ketones, UA: NEGATIVE

## 2013-10-15 NOTE — Patient Instructions (Signed)
Breastfeeding A change in hormones during your pregnancy causes growth of your breast tissue and an increase in number and size of milk ducts. The hormone prolactin allows proteins, sugars, and fats from your blood supply to make breast milk in your milk-producing glands. The hormone progesterone prevents breast milk from being released before the birth of your baby. After the birth of your baby, your progesterone level decreases allowing breast milk to be released. Thoughts of your baby, as well as his or her sucking or crying, can stimulate the release of milk from the milk-producing glands. Deciding to breastfeed (nurse) is one of the best choices you can make for you and your baby. The information that follows gives a brief review of the benefits, as well as other important skills to know about breastfeeding. BENEFITS OF BREASTFEEDING For your baby  The first milk (colostrum) helps your baby's digestive system function better.   There are antibodies in your milk that help your baby fight off infections.   Your baby has a lower incidence of asthma, allergies, and sudden infant death syndrome (SIDS).   The nutrients in breast milk are better for your baby than infant formulas.  Breast milk improves your baby's brain development.   Your baby will have less gas, colic, and constipation.  Your baby is less likely to develop other conditions, such as childhood obesity, asthma, or diabetes mellitus. For you  Breastfeeding helps develop a very special bond between you and your baby.   Breastfeeding is convenient, always available at the correct temperature, and costs nothing.   Breastfeeding helps to burn calories and helps you lose the weight gained during pregnancy.   Breastfeeding makes your uterus contract back down to normal size faster and slows bleeding following delivery.   Breastfeeding mothers have a lower risk of developing osteoporosis or breast or ovarian cancer later  in life.  BREASTFEEDING FREQUENCY  A healthy, full-term baby may breastfeed as often as every hour or space his or her feedings to every 3 hours. Breastfeeding frequency will vary from baby to baby.   Newborns should be fed no less than every 2 3 hours during the day and every 4 5 hours during the night. You should breastfeed a minimum of 8 feedings in a 24 hour period.  Awaken your baby to breastfeed if it has been 3 4 hours since the last feeding.  Breastfeed when you feel the need to reduce the fullness of your breasts or when your newborn shows signs of hunger. Signs that your baby may be hungry include:  Increased alertness or activity.  Stretching.  Movement of the head from side to side.  Movement of the head and opening of the mouth when the corner of the mouth or cheek is stroked (rooting).  Increased sucking sounds, smacking lips, cooing, sighing, or squeaking.  Hand-to-mouth movements.  Increased sucking of fingers or hands.  Fussing.  Intermittent crying.  Signs of extreme hunger will require calming and consoling before you try to feed your baby. Signs of extreme hunger may include:  Restlessness.  A loud, strong cry.  Screaming.  Frequent feeding will help you make more milk and will help prevent problems, such as sore nipples and engorgement of the breasts.  BREASTFEEDING   Whether lying down or sitting, be sure that the baby's abdomen is facing your abdomen.   Support your breast with 4 fingers under your breast and your thumb above your nipple. Make sure your fingers are well away from   your nipple and your baby's mouth.   Stroke your baby's lips gently with your finger or nipple.   When your baby's mouth is open wide enough, place all of your nipple and as much of the colored area around your nipple (areola) as possible into your baby's mouth.  More areola should be visible above his or her upper lip than below his or her lower lip.  Your  baby's tongue should be between his or her lower gum and your breast.  Ensure that your baby's mouth is correctly positioned around the nipple (latched). Your baby's lips should create a seal on your breast.  Signs that your baby has effectively latched onto your nipple include:  Tugging or sucking without pain.  Swallowing heard between sucks.  Absent click or smacking sound.  Muscle movement above and in front of his or her ears with sucking.  Your baby must suck about 2 3 minutes in order to get your milk. Allow your baby to feed on each breast as long as he or she wants. Nurse your baby until he or she unlatches or falls asleep at the first breast, then offer the second breast.  Signs that your baby is full and satisfied include:  A gradual decrease in the number of sucks or complete cessation of sucking.  Falling asleep.  Extension or relaxation of his or her body.  Retention of a small amount of milk in his or her mouth.  Letting go of your breast by himself or herself.  Signs of effective breastfeeding in you include:  Breasts that have increased firmness, weight, and size prior to feeding.  Breasts that are softer after nursing.  Increased milk volume, as well as a change in milk consistency and color by the 5th day of breastfeeding.  Breast fullness relieved by breastfeeding.  Nipples are not sore, cracked, or bleeding.  If needed, break the suction by putting your finger into the corner of your baby's mouth and sliding your finger between his or her gums. Then, remove your breast from his or her mouth.  It is common for babies to spit up a small amount after a feeding.  Babies often swallow air during feeding. This can make babies fussy. Burping your baby between breasts can help with this.  Vitamin D supplements are recommended for babies who get only breast milk.  Avoid using a pacifier during your baby's first 4 6 weeks.  Avoid supplemental feedings of  water, formula, or juice in place of breastfeeding. Breast milk is all the food your baby needs. It is not necessary for your baby to have water or formula. Your breasts will make more milk if supplemental feedings are avoided during the early weeks. HOW TO TELL WHETHER YOUR BABY IS GETTING ENOUGH BREAST MILK Wondering whether or not your baby is getting enough milk is a common concern among mothers. You can be assured that your baby is getting enough milk if:   Your baby is actively sucking and you hear swallowing.   Your baby seems relaxed and satisfied after a feeding.   Your baby nurses at least 8 12 times in a 24 hour time period.  During the first 3 5 days of age:  Your baby is wetting at least 3 5 diapers in a 24 hour period. The urine should be clear and pale yellow.  Your baby is having at least 3 4 stools in a 24 hour period. The stool should be soft and yellow.  At   5 7 days of age, your baby is having at least 3 6 stools in a 24 hour period. The stool should be seedy and yellow by 5 days of age.  Your baby has a weight loss less than 7 10% during the first 3 days of age.  Your baby does not lose weight after 3 7 days of age.  Your baby gains 4 7 ounces each week after he or she is 4 days of age.  Your baby gains weight by 5 days of age and is back to birth weight within 2 weeks. ENGORGEMENT In the first week after your baby is born, you may experience extremely full breasts (engorgement). When engorged, your breasts may feel heavy, warm, or tender to the touch. Engorgement peaks within 24 48 hours after delivery of your baby.  Engorgement may be reduced by:  Continuing to breastfeed.  Increasing the frequency of breastfeeding.  Taking warm showers or applying warm, moist heat to your breasts just before each feeding. This increases circulation and helps the milk flow.   Gently massaging your breast before and during the feedings. With your fingertips, massage from  your chest wall towards your nipple in a circular motion.   Ensuring that your baby empties at least one breast at every feeding. It also helps to start the next feeding on the opposite breast.   Expressing breast milk by hand or by using a breast pump to empty the breasts if your baby is sleepy, or not nursing well. You may also want to express milk if you are returning to work oryou feel you are getting engorged.  Ensuring your baby is latched on and positioned properly while breastfeeding. If you follow these suggestions, your engorgement should improve in 24 48 hours. If you are still experiencing difficulty, call your lactation consultant or caregiver.  CARING FOR YOURSELF Take care of your breasts.  Bathe or shower daily.   Avoid using soap on your nipples.   Wear a supportive bra. Avoid wearing underwire style bras.  Air dry your nipples for a 3 4minutes after each feeding.   Use only cotton bra pads to absorb breast milk leakage. Leaking of breast milk between feedings is normal.   Use only pure lanolin on your nipples after nursing. You do not need to wash it off before feeding your baby again. Another option is to express a few drops of breast milk and gently massage that milk into your nipples.  Continue breast self-awareness checks. Take care of yourself.  Eat healthy foods. Alternate 3 meals with 3 snacks.  Avoid foods that you notice affect your baby in a bad way.  Drink milk, fruit juice, and water to satisfy your thirst (about 8 glasses a day).   Rest often, relax, and take your prenatal vitamins to prevent fatigue, stress, and anemia.  Avoid chewing and smoking tobacco.  Avoid alcohol and drug use.  Take over-the-counter and prescribed medicine only as directed by your caregiver or pharmacist. You should always check with your caregiver or pharmacist before taking any new medicine, vitamin, or herbal supplement.  Know that pregnancy is possible while  breastfeeding. If desired, talk to your caregiver about family planning and safe birth control methods that may be used while breastfeeding. SEEK MEDICAL CARE IF:   You feel like you want to stop breastfeeding or have become frustrated with breastfeeding.  You have painful breasts or nipples.  Your nipples are cracked or bleeding.  Your breasts are red, tender,   or warm.  You have a swollen area on either breast.  You have a fever or chills.  You have nausea or vomiting.  You have drainage from your nipples.  Your breasts do not become full before feedings by the 5th day after delivery.  You feel sad and depressed.  Your baby is too sleepy to eat well.  Your baby is having trouble sleeping.   Your baby is wetting less than 3 diapers in a 24 hour period.  Your baby has less than 3 stools in a 24 hour period.  Your baby's skin or the white part of his or her eyes becomes more yellow.   Your baby is not gaining weight by 5 days of age. MAKE SURE YOU:   Understand these instructions.  Will watch your condition.  Will get help right away if you are not doing well or get worse. Document Released: 11/29/2005 Document Revised: 08/23/2012 Document Reviewed: 07/05/2012 [employer] Patient Information 2014 [employer].  

## 2013-10-15 NOTE — Progress Notes (Signed)
Reports good fm. Denies uc's, lof, vb, urinary frequency, urgency, hesitancy, or dysuria.  No complaints.  Reviewed ptl s/s, fkc.  All questions answered. F/U in 2wks for visit.  

## 2013-10-16 LAB — URINE CULTURE: Colony Count: NO GROWTH

## 2013-10-29 ENCOUNTER — Encounter: Payer: Self-pay | Admitting: Women's Health

## 2013-10-29 ENCOUNTER — Ambulatory Visit (INDEPENDENT_AMBULATORY_CARE_PROVIDER_SITE_OTHER): Payer: BC Managed Care – PPO | Admitting: Women's Health

## 2013-10-29 VITALS — BP 130/80 | Wt 184.0 lb

## 2013-10-29 DIAGNOSIS — Z1389 Encounter for screening for other disorder: Secondary | ICD-10-CM

## 2013-10-29 DIAGNOSIS — O99019 Anemia complicating pregnancy, unspecified trimester: Secondary | ICD-10-CM

## 2013-10-29 DIAGNOSIS — Z3483 Encounter for supervision of other normal pregnancy, third trimester: Secondary | ICD-10-CM

## 2013-10-29 DIAGNOSIS — Z331 Pregnant state, incidental: Secondary | ICD-10-CM

## 2013-10-29 LAB — POCT URINALYSIS DIPSTICK
Glucose, UA: NEGATIVE
Nitrite, UA: NEGATIVE

## 2013-10-29 NOTE — Patient Instructions (Addendum)
[facility] [phone]          [facility] [phone]               [facility] [phone]               [facility] ([address]) [phone]   Etonogestrel implant- Nexplanon What is this medicine? ETONOGESTREL (et oh noe JES trel) is a contraceptive (birth control) device. It is used to prevent pregnancy. It can be used for up to 3 years. This medicine may be used for other purposes; ask your health care provider or pharmacist if you have questions. COMMON BRAND NAME(S): Implanon, Nexplanon  What should I tell my health care provider before I take this medicine? They need to know if you have any of these conditions: -abnormal vaginal bleeding -blood vessel disease or blood clots -cancer of the breast, cervix, or liver -depression -diabetes -gallbladder disease -headaches -heart disease or recent heart attack -high blood pressure -high cholesterol -kidney disease -liver disease -renal disease -seizures -tobacco smoker -an unusual or allergic reaction to etonogestrel, other hormones, anesthetics or antiseptics, medicines, foods, dyes, or preservatives -pregnant or trying to get pregnant -breast-feeding How should I use this medicine? This device is inserted just under the skin on the inner side of your upper arm by a health care professional. Talk to your pediatrician regarding the use of this medicine in children. Special care may be needed. Overdosage: If you think you've taken too much of this medicine contact a poison control center or emergency room at once. Overdosage: If you think you have taken too much of this medicine contact a poison control center or emergency room at once. NOTE: This medicine is only for you. Do not share this medicine with others. What if I miss a dose? This does not apply. What may interact with this medicine? Do not take this medicine with any of the  following medications: -amprenavir -bosentan -fosamprenavir This medicine may also interact with the following medications: -barbiturate medicines for inducing sleep or treating seizures -certain medicines for fungal infections like ketoconazole and itraconazole -griseofulvin -medicines to treat seizures like carbamazepine, felbamate, oxcarbazepine, phenytoin, topiramate -modafinil -phenylbutazone -rifampin -some medicines to treat HIV infection like atazanavir, indinavir, lopinavir, nelfinavir, tipranavir, ritonavir -St. John's wort This list may not describe all possible interactions. Give your health care provider a list of all the medicines, herbs, non-prescription drugs, or dietary supplements you use. Also tell them if you smoke, drink alcohol, or use illegal drugs. Some items may interact with your medicine. What should I watch for while using this medicine? This product does not protect you against HIV infection (AIDS) or other sexually transmitted diseases. You should be able to feel the implant by pressing your fingertips over the skin where it was inserted. Tell your doctor if you cannot feel the implant. What side effects may I notice from receiving this medicine? Side effects that you should report to your doctor or health care professional as soon as possible: -allergic reactions like skin rash, itching or hives, swelling of the face, lips, or tongue -breast lumps -changes in vision -confusion, trouble speaking or understanding -dark urine -depressed mood -general ill feeling or flu-like symptoms -light-colored stools -loss of appetite, nausea -right upper belly pain -severe headaches -severe pain, swelling, or tenderness in the abdomen -shortness of breath, chest pain, swelling in a leg -signs of pregnancy -sudden numbness or weakness of the face, arm or  leg -trouble walking, dizziness, loss of balance or coordination -unusual vaginal bleeding, discharge -unusually  weak or tired -yellowing of the eyes or skin Side effects that usually do not require medical attention (Report these to your doctor or health care professional if they continue or are bothersome.): -acne -breast pain -changes in weight -cough -fever or chills -headache -irregular menstrual bleeding -itching, burning, and vaginal discharge -pain or difficulty passing urine -sore throat This list may not describe all possible side effects. Call your doctor for medical advice about side effects. You may report side effects to FDA at 1-800-FDA-1088. Where should I keep my medicine? This drug is given in a hospital or clinic and will not be stored at home. NOTE: This sheet is a summary. It may not cover all possible information. If you have questions about this medicine, talk to your doctor, pharmacist, or health care provider.  2014, [employer]vier/[employer]. (2012-06-05 15:37:45)

## 2013-10-29 NOTE — Progress Notes (Signed)
Reports good fm. Denies regular uc's, lof, vb, urinary frequency, urgency, hesitancy, or dysuria.  Pressure when walking.  Reviewed ptl s/s, fkc.  All questions answered. F/U in 1wk for visit and gbs, gc/ch.

## 2013-11-05 ENCOUNTER — Ambulatory Visit (INDEPENDENT_AMBULATORY_CARE_PROVIDER_SITE_OTHER): Payer: BC Managed Care – PPO | Admitting: Women's Health

## 2013-11-05 ENCOUNTER — Encounter: Payer: Self-pay | Admitting: Women's Health

## 2013-11-05 VITALS — BP 100/76 | Wt 183.0 lb

## 2013-11-05 DIAGNOSIS — Z3480 Encounter for supervision of other normal pregnancy, unspecified trimester: Secondary | ICD-10-CM

## 2013-11-05 DIAGNOSIS — O99019 Anemia complicating pregnancy, unspecified trimester: Secondary | ICD-10-CM

## 2013-11-05 DIAGNOSIS — Z331 Pregnant state, incidental: Secondary | ICD-10-CM

## 2013-11-05 DIAGNOSIS — Z1389 Encounter for screening for other disorder: Secondary | ICD-10-CM

## 2013-11-05 LAB — POCT URINALYSIS DIPSTICK
Ketones, UA: NEGATIVE
Protein, UA: NEGATIVE

## 2013-11-05 LAB — OB RESULTS CONSOLE GC/CHLAMYDIA: Gonorrhea: NEGATIVE

## 2013-11-05 NOTE — Patient Instructions (Signed)
Call the office or go to [facility] if:  You begin to have strong, frequent contractions  Your water breaks.  Sometimes it is a big gush of fluid, sometimes it is just a trickle that keeps getting your panties wet or running down your legs  You have vaginal bleeding.  It is normal to have a small amount of spotting if your cervix was checked.   You don't feel your baby moving like normal.  If you don't, get you something to eat and drink and lay down and focus on feeling your baby move.  You should feel at least 10 movements in 2 hours.  If you don't, you should call the office or go to [facility].   Braxton Hicks Contractions Pregnancy is commonly associated with contractions of the uterus throughout the pregnancy. Towards the end of pregnancy (32 to 34 weeks), these contractions (Braxton Hicks) can develop more often and may become more forceful. This is not true labor because these contractions do not result in opening (dilatation) and thinning of the cervix. They are sometimes difficult to tell apart from true labor because these contractions can be forceful and people have different pain tolerances. You should not feel embarrassed if you go to the hospital with false labor. Sometimes, the only way to tell if you are in true labor is for your caregiver to follow the changes in the cervix. How to tell the difference between true and false labor:  False labor.  The contractions of false labor are usually shorter, irregular and not as hard as those of true labor.  They are often felt in the front of the lower abdomen and in the groin.  They may leave with walking around or changing positions while lying down.  They get weaker and are shorter lasting as time goes on.  These contractions are usually irregular.  They do not usually become progressively stronger, regular and closer together as with true labor.  True labor.  Contractions in true labor last 30 to 70 seconds,  become very regular, usually become more intense, and increase in frequency.  They do not go away with walking.  The discomfort is usually felt in the top of the uterus and spreads to the lower abdomen and low back.  True labor can be determined by your caregiver with an exam. This will show that the cervix is dilating and getting thinner. If there are no prenatal problems or other health problems associated with the pregnancy, it is completely safe to be sent home with false labor and await the onset of true labor. HOME CARE INSTRUCTIONS   Keep up with your usual exercises and instructions.  Take medications as directed.  Keep your regular prenatal appointment.  Eat and drink lightly if you think you are going into labor.  If BH contractions are making you uncomfortable:  Change your activity position from lying down or resting to walking/walking to resting.  Sit and rest in a tub of warm water.  Drink 2 to 3 glasses of water. Dehydration may cause B-H contractions.  Do slow and deep breathing several times an hour. SEEK IMMEDIATE MEDICAL CARE IF:   Your contractions continue to become stronger, more regular, and closer together.  You have a gushing, burst or leaking of fluid from the vagina.  An oral temperature above 102 F (38.9 C) develops.  You have passage of blood-tinged mucus.  You develop vaginal bleeding.  You develop continuous belly (abdominal) pain.  You have   low back pain that you never had before.  You feel the baby's head pushing down causing pelvic pressure.  The baby is not moving as much as it used to. Document Released: 11/29/2005 Document Revised: 02/21/2012 Document Reviewed: 05/23/2009 [employer] Patient Information 2014 [employer].  

## 2013-11-05 NOTE — Progress Notes (Signed)
Reports good fm. Denies uc's, lof, vb, urinary frequency, urgency, hesitancy, or dysuria.  Pressure.  Reviewed labor s/s, fkc.  All questions answered. F/U in 1wk for visit. GBS, gc/ch today.

## 2013-11-06 ENCOUNTER — Encounter: Payer: Self-pay | Admitting: Women's Health

## 2013-11-07 ENCOUNTER — Encounter: Payer: Self-pay | Admitting: Women's Health

## 2013-11-07 LAB — STREP B DNA PROBE: GBSP: NEGATIVE

## 2013-11-12 ENCOUNTER — Encounter: Payer: Self-pay | Admitting: Women's Health

## 2013-11-12 ENCOUNTER — Ambulatory Visit (INDEPENDENT_AMBULATORY_CARE_PROVIDER_SITE_OTHER): Payer: BC Managed Care – PPO | Admitting: Women's Health

## 2013-11-12 VITALS — BP 108/80 | Wt 184.5 lb

## 2013-11-12 DIAGNOSIS — O99019 Anemia complicating pregnancy, unspecified trimester: Secondary | ICD-10-CM

## 2013-11-12 DIAGNOSIS — Z331 Pregnant state, incidental: Secondary | ICD-10-CM

## 2013-11-12 DIAGNOSIS — Z3483 Encounter for supervision of other normal pregnancy, third trimester: Secondary | ICD-10-CM

## 2013-11-12 DIAGNOSIS — Z1389 Encounter for screening for other disorder: Secondary | ICD-10-CM

## 2013-11-12 LAB — POCT URINALYSIS DIPSTICK
Glucose, UA: NEGATIVE
Ketones, UA: NEGATIVE
Nitrite, UA: NEGATIVE
Protein, UA: NEGATIVE

## 2013-11-12 NOTE — Progress Notes (Signed)
Reports good fm. Denies uc's, lof, vb, urinary frequency, urgency, hesitancy, or dysuria.  No complaints.  Requests SVE. Reviewed labor s/s, fkc.  All questions answered. F/U in 1wk for visit.

## 2013-11-12 NOTE — Patient Instructions (Signed)
Call the office or go to [facility] if:  You begin to have strong, frequent contractions  Your water breaks.  Sometimes it is a big gush of fluid, sometimes it is just a trickle that keeps getting your panties wet or running down your legs  You have vaginal bleeding.  It is normal to have a small amount of spotting if your cervix was checked.   You don't feel your baby moving like normal.  If you don't, get you something to eat and drink and lay down and focus on feeling your baby move.  You should feel at least 10 movements in 2 hours.  If you don't, you should call the office or go to [facility].     Risks and Benefits of Epidural Anesthesia Anesthesia is a loss of sensation produced by medicines called anesthetics. Epidural anesthesia is a type of anesthesia that is produced by an injection into the lower membrane that surrounds the spinal cord. It provides pain relief to a certain area of the body. Epidural anesthesia can be used during many different procedures or during childbirth.  BENEFITS OF EPIDURAL ANESTHESIA  Effectively relieves pain in areas of the body below the level of the spinal cord where the epidural is placed.  Provides continuous pain relief when connected to a constant flow of pain medicine.  Is the safest type of anesthesia for those with medical conditions.   Gives better pain control after a procedure or childbirth.   Lessens the amount of extra pain medicine needed after a procedure or childbirth.  Allows you to be less sleepy after a procedure or childbirth compared to other pain medicines that may be taken by mouth or given by injection.  Allows you to move around sooner after a procedure or childbirth.   Allows you to be awake during a procedure or childbirth.  RISKS OF EPIDURAL ANESTHESIA Risks may include:   A severe headache called a spinal headache.   Uneven, incomplete, or no pain relief.  Difficulty  breathing.  Inability to move (paralysis).  Seizures.   Loss of consciousness.  Difficulty urinating or having bowel movements after the epidural.  Infection of the spine.   A drop in blood pressure (hypotension).   Nerve damage.  Bleeding between the spinal vertebrae and the outside lining of the spinal cord (spinal hematoma).   An allergic or toxic reaction to the epidural anesthesia.   Difficulty breastfeeding right after giving birth (postpartum period).  Loss of heart function (cardiac arrest). This is rare. SIDE EFFECTS OF EPIDURAL ANESTHESIA  Nausea and vomiting.  Shivering.  Dizziness and fainting.  Fever.  Itching. Document Released: 11/29/2005 Document Revised: 08/01/2013 Document Reviewed: 06/21/2013 [facility] Patient Information 2014 [place], [place].

## 2013-11-19 ENCOUNTER — Encounter: Payer: Self-pay | Admitting: Women's Health

## 2013-11-19 ENCOUNTER — Ambulatory Visit (INDEPENDENT_AMBULATORY_CARE_PROVIDER_SITE_OTHER): Payer: BC Managed Care – PPO | Admitting: Women's Health

## 2013-11-19 VITALS — BP 102/58 | Wt 186.6 lb

## 2013-11-19 DIAGNOSIS — Z1389 Encounter for screening for other disorder: Secondary | ICD-10-CM

## 2013-11-19 DIAGNOSIS — Z331 Pregnant state, incidental: Secondary | ICD-10-CM

## 2013-11-19 DIAGNOSIS — Z3483 Encounter for supervision of other normal pregnancy, third trimester: Secondary | ICD-10-CM

## 2013-11-19 DIAGNOSIS — O99019 Anemia complicating pregnancy, unspecified trimester: Secondary | ICD-10-CM

## 2013-11-19 DIAGNOSIS — O1213 Gestational proteinuria, third trimester: Secondary | ICD-10-CM

## 2013-11-19 LAB — POCT URINALYSIS DIPSTICK: Nitrite, UA: NEGATIVE

## 2013-11-19 NOTE — Patient Instructions (Signed)
Call the office or go to [facility] if:  You begin to have strong, frequent contractions  Your water breaks.  Sometimes it is a big gush of fluid, sometimes it is just a trickle that keeps getting your panties wet or running down your legs  You have vaginal bleeding.  It is normal to have a small amount of spotting if your cervix was checked.   You don't feel your baby moving like normal.  If you don't, get you something to eat and drink and lay down and focus on feeling your baby move.  You should feel at least 10 movements in 2 hours.  If you don't, you should call the office or go to [facility].   

## 2013-11-19 NOTE — Progress Notes (Signed)
Reports good fm. Denies uc's, lof, vb, urinary frequency, urgency, hesitancy, or dysuria.  Continued leuks and tr prot in urine, neg cx 1 month ago, will send another today. No complaints. Requests SVE: 1.5/50/-2. Offered/discussed membrane sweeping- declined at this time. Reviewed labor s/s, fkc.  All questions answered. F/U in 1wk for visit.

## 2013-11-24 ENCOUNTER — Encounter (HOSPITAL_COMMUNITY): Payer: Self-pay | Admitting: *Deleted

## 2013-11-24 ENCOUNTER — Inpatient Hospital Stay (HOSPITAL_COMMUNITY)
Admission: AD | Admit: 2013-11-24 | Discharge: 2013-11-26 | DRG: 775 | Disposition: A | Discharge status: Home / Self Care | Payer: BC Managed Care – PPO | Source: Ambulatory Visit | Attending: Obstetrics & Gynecology | Admitting: Obstetrics & Gynecology

## 2013-11-24 DIAGNOSIS — IMO0001 Reserved for inherently not codable concepts without codable children: Secondary | ICD-10-CM

## 2013-11-24 DIAGNOSIS — Z3483 Encounter for supervision of other normal pregnancy, third trimester: Secondary | ICD-10-CM

## 2013-11-24 NOTE — MAU Note (Signed)
Pt. Has had uc's alll day today, but they weren't that bad, but got more intense and closer together after 9 tonight.  Pt. States that uc's are 3-5 minutes apart.

## 2013-11-25 ENCOUNTER — Inpatient Hospital Stay (HOSPITAL_COMMUNITY): Payer: BC Managed Care – PPO | Admitting: Anesthesiology

## 2013-11-25 ENCOUNTER — Encounter (HOSPITAL_COMMUNITY): Payer: BC Managed Care – PPO | Admitting: Anesthesiology

## 2013-11-25 ENCOUNTER — Encounter (HOSPITAL_COMMUNITY): Payer: Self-pay | Admitting: Obstetrics

## 2013-11-25 DIAGNOSIS — IMO0001 Reserved for inherently not codable concepts without codable children: Secondary | ICD-10-CM

## 2013-11-25 LAB — CBC
HCT: 35 % — ABNORMAL LOW (ref 36.0–46.0)
Hemoglobin: 11.7 g/dL — ABNORMAL LOW (ref 12.0–15.0)
MCV: 90.2 fL (ref 78.0–100.0)
Platelets: 284 10*3/uL (ref 150–400)
RBC: 3.88 MIL/uL (ref 3.87–5.11)
RDW: 14.8 % (ref 11.5–15.5)
WBC: 10.2 10*3/uL (ref 4.0–10.5)

## 2013-11-25 LAB — ABO/RH: ABO/RH(D): A POS

## 2013-11-25 LAB — TYPE AND SCREEN
ABO/RH(D): A POS
Antibody Screen: NEGATIVE

## 2013-11-25 MED ORDER — BENZOCAINE-MENTHOL 20-0.5 % EX AERO
1.0000 "application " | INHALATION_SPRAY | CUTANEOUS | Status: DC | PRN
  Administered 2013-11-25: 1 via TOPICAL

## 2013-11-25 MED ORDER — PHENYLEPHRINE 40 MCG/ML (10ML) SYRINGE FOR IV PUSH (FOR BLOOD PRESSURE SUPPORT)
80.0000 ug | PREFILLED_SYRINGE | INTRAVENOUS | Status: DC | PRN
  Filled 2013-11-25: qty 2

## 2013-11-25 MED ORDER — OXYCODONE-ACETAMINOPHEN 5-325 MG PO TABS: 1.0000 | ORAL_TABLET | ORAL | Status: DC | PRN

## 2013-11-25 MED ORDER — WITCH HAZEL-GLYCERIN EX PADS: 1.0000 "application " | MEDICATED_PAD | CUTANEOUS | Status: DC | PRN

## 2013-11-25 MED ORDER — FENTANYL CITRATE 0.05 MG/ML IJ SOLN: 100.0000 ug | INTRAMUSCULAR | Status: DC | PRN

## 2013-11-25 MED ORDER — PHENYLEPHRINE 40 MCG/ML (10ML) SYRINGE FOR IV PUSH (FOR BLOOD PRESSURE SUPPORT)
80.0000 ug | PREFILLED_SYRINGE | INTRAVENOUS | Status: DC | PRN
  Filled 2013-11-25: qty 10
  Filled 2013-11-25: qty 2

## 2013-11-25 MED ORDER — EPHEDRINE 5 MG/ML INJ
10.0000 mg | INTRAVENOUS | Status: DC | PRN
  Filled 2013-11-25: qty 2

## 2013-11-25 MED ORDER — FENTANYL 2.5 MCG/ML BUPIVACAINE 1/10 % EPIDURAL INFUSION (WH - ANES)
14.0000 mL/h | INTRAMUSCULAR | Status: DC | PRN
  Filled 2013-11-25: qty 125

## 2013-11-25 MED ORDER — DIPHENHYDRAMINE HCL 25 MG PO CAPS: 25.0000 mg | ORAL_CAPSULE | Freq: Four times a day (QID) | ORAL | Status: DC | PRN

## 2013-11-25 MED ORDER — LIDOCAINE HCL (PF) 1 % IJ SOLN
30.0000 mL | INTRAMUSCULAR | Status: DC | PRN
  Filled 2013-11-25: qty 30

## 2013-11-25 MED ORDER — EPHEDRINE 5 MG/ML INJ
10.0000 mg | INTRAVENOUS | Status: DC | PRN
  Filled 2013-11-25: qty 4
  Filled 2013-11-25: qty 2

## 2013-11-25 MED ORDER — ZOLPIDEM TARTRATE 5 MG PO TABS: 5.0000 mg | ORAL_TABLET | Freq: Every evening | ORAL | Status: DC | PRN

## 2013-11-25 MED ORDER — FENTANYL 2.5 MCG/ML BUPIVACAINE 1/10 % EPIDURAL INFUSION (WH - ANES)
INTRAMUSCULAR | Status: DC | PRN
  Administered 2013-11-25: 14 mL/h via EPIDURAL

## 2013-11-25 MED ORDER — HYDROXYZINE HCL 50 MG PO TABS
50.0000 mg | ORAL_TABLET | Freq: Four times a day (QID) | ORAL | Status: DC | PRN
  Filled 2013-11-25: qty 1

## 2013-11-25 MED ORDER — DIPHENHYDRAMINE HCL 50 MG/ML IJ SOLN: 12.5000 mg | INTRAMUSCULAR | Status: DC | PRN

## 2013-11-25 MED ORDER — OXYTOCIN BOLUS FROM INFUSION: 500.0000 mL | INTRAVENOUS | Status: DC

## 2013-11-25 MED ORDER — ONDANSETRON HCL 4 MG PO TABS: 4.0000 mg | ORAL_TABLET | ORAL | Status: DC | PRN

## 2013-11-25 MED ORDER — LIDOCAINE HCL (PF) 1 % IJ SOLN
INTRAMUSCULAR | Status: DC | PRN
  Administered 2013-11-25 (×2): 4 mL

## 2013-11-25 MED ORDER — ONDANSETRON HCL 4 MG/2ML IJ SOLN
4.0000 mg | Freq: Four times a day (QID) | INTRAMUSCULAR | Status: DC | PRN
  Administered 2013-11-25: 4 mg via INTRAVENOUS
  Filled 2013-11-25: qty 2

## 2013-11-25 MED ORDER — OXYTOCIN 40 UNITS IN LACTATED RINGERS INFUSION - SIMPLE MED
62.5000 mL/h | INTRAVENOUS | Status: DC
  Administered 2013-11-25: 62.5 mL/h via INTRAVENOUS
  Filled 2013-11-25: qty 1000

## 2013-11-25 MED ORDER — SENNOSIDES-DOCUSATE SODIUM 8.6-50 MG PO TABS
2.0000 | ORAL_TABLET | ORAL | Status: DC
  Administered 2013-11-25: 2 via ORAL
  Filled 2013-11-25: qty 2

## 2013-11-25 MED ORDER — LACTATED RINGERS IV SOLN
INTRAVENOUS | Status: DC
  Administered 2013-11-25 (×2): via INTRAVENOUS

## 2013-11-25 MED ORDER — ONDANSETRON HCL 4 MG/2ML IJ SOLN: 4.0000 mg | INTRAMUSCULAR | Status: DC | PRN

## 2013-11-25 MED ORDER — LANOLIN HYDROUS EX OINT: TOPICAL_OINTMENT | CUTANEOUS | Status: DC | PRN

## 2013-11-25 MED ORDER — IBUPROFEN 600 MG PO TABS
600.0000 mg | ORAL_TABLET | Freq: Four times a day (QID) | ORAL | Status: DC | PRN
  Administered 2013-11-25: 600 mg via ORAL
  Filled 2013-11-25: qty 1

## 2013-11-25 MED ORDER — ACETAMINOPHEN 325 MG PO TABS
650.0000 mg | ORAL_TABLET | ORAL | Status: DC | PRN
  Administered 2013-11-25: 650 mg via ORAL
  Filled 2013-11-25: qty 2

## 2013-11-25 MED ORDER — DIBUCAINE 1 % RE OINT: 1.0000 "application " | TOPICAL_OINTMENT | RECTAL | Status: DC | PRN

## 2013-11-25 MED ORDER — LACTATED RINGERS IV SOLN: 500.0000 mL | INTRAVENOUS | Status: DC | PRN

## 2013-11-25 MED ORDER — LACTATED RINGERS IV SOLN
500.0000 mL | Freq: Once | INTRAVENOUS | Status: AC
  Administered 2013-11-25: 500 mL via INTRAVENOUS

## 2013-11-25 MED ORDER — PRENATAL MULTIVITAMIN CH
1.0000 | ORAL_TABLET | Freq: Every day | ORAL | Status: DC
  Administered 2013-11-26: 1 via ORAL
  Filled 2013-11-25: qty 1

## 2013-11-25 MED ORDER — TETANUS-DIPHTH-ACELL PERTUSSIS 5-2.5-18.5 LF-MCG/0.5 IM SUSP: 0.5000 mL | Freq: Once | INTRAMUSCULAR | Status: DC

## 2013-11-25 MED ORDER — CITRIC ACID-SODIUM CITRATE 334-500 MG/5ML PO SOLN: 30.0000 mL | ORAL | Status: DC | PRN

## 2013-11-25 MED ORDER — FLEET ENEMA 7-19 GM/118ML RE ENEM: 1.0000 | ENEMA | RECTAL | Status: DC | PRN

## 2013-11-25 MED ORDER — SIMETHICONE 80 MG PO CHEW: 80.0000 mg | CHEWABLE_TABLET | ORAL | Status: DC | PRN

## 2013-11-25 MED ORDER — IBUPROFEN 600 MG PO TABS
600.0000 mg | ORAL_TABLET | Freq: Four times a day (QID) | ORAL | Status: DC
  Administered 2013-11-25 – 2013-11-26 (×4): 600 mg via ORAL
  Filled 2013-11-25 (×4): qty 1

## 2013-11-25 NOTE — Anesthesia Procedure Notes (Signed)
Epidural Patient location during procedure: OB Start time: 11/25/2013 1:49 AM End time: 11/25/2013 1:59 AM  Staffing Anesthesiologist: Lewie Loron R Performed by: anesthesiologist   Preanesthetic Checklist Completed: patient identified, pre-op evaluation, timeout performed, IV checked, risks and benefits discussed and monitors and equipment checked  Epidural Patient position: sitting Prep: site prepped and draped and DuraPrep Patient monitoring: blood pressure, continuous pulse ox and heart rate Approach: midline Injection technique: LOR saline and LOR air  Needle:  Needle type: Tuohy  Needle gauge: 17 G Needle length: 9 cm Needle insertion depth: 5 cm Catheter type: closed end flexible Catheter size: 19 Gauge Catheter at skin depth: 11 cm Test dose: negative  Assessment Sensory level: T8 Events: blood not aspirated, injection not painful, no injection resistance, negative IV test and no paresthesia  Additional Notes Reason for block:procedure for pain

## 2013-11-25 NOTE — Anesthesia Preprocedure Evaluation (Signed)
Anesthesia Evaluation  Patient identified by MRN, date of birth, ID band Patient awake    Reviewed: Allergy & Precautions, H&P , NPO status , Patient's Chart, lab work & pertinent test results  Airway Mallampati: I TM Distance: >3 FB Neck ROM: Full    Dental  (+) Dental Advisory Given   Pulmonary neg pulmonary ROS,          Cardiovascular negative cardio ROS      Neuro/Psych negative neurological ROS  negative psych ROS   GI/Hepatic negative GI ROS, Neg liver ROS,   Endo/Other  negative endocrine ROS  Renal/GU negative Renal ROS  negative genitourinary   Musculoskeletal negative musculoskeletal ROS (+)   Abdominal   Peds negative pediatric ROS (+)  Hematology negative hematology ROS (+)   Anesthesia Other Findings   Reproductive/Obstetrics (+) Pregnancy                           Anesthesia Physical Anesthesia Plan  ASA: II  Anesthesia Plan: Epidural   Post-op Pain Management:    Induction:   Airway Management Planned:   Additional Equipment:   Intra-op Plan:   Post-operative Plan:   Informed Consent: I have reviewed the patients History and Physical, chart, labs and discussed the procedure including the risks, benefits and alternatives for the proposed anesthesia with the patient or authorized representative who has indicated his/her understanding and acceptance.     Plan Discussed with:   Anesthesia Plan Comments:         Anesthesia Quick Evaluation

## 2013-11-25 NOTE — H&P (Signed)
Attestation of Attending Supervision of Advanced Practitioner (PA/CNM/NP): Evaluation and management procedures were performed by the Advanced Practitioner under my supervision and collaboration.  I have reviewed the Advanced Practitioner's note and chart, and I agree with the management and plan.  Marinda Tyer, MD, FACOG Attending Obstetrician & Gynecologist Faculty Practice, [facility]  

## 2013-11-25 NOTE — Progress Notes (Signed)
Patient ID: Kayla Fernandez, female   DOB: 06-30-1986, 27 y.o.   MRN: [id number] Kayla Fernandez is a 27 y.o. G2P1001 at [redacted]w[redacted]d  admitted for active labor  Subjective: Comfortable w/ epidural, no urge to push Has HA, not resolved by apap, feels like it is b/c she hasn't had any sleep and is hungry, she would like to eat a popsicle  Objective: BP 125/70  Pulse 95  Temp(Src) 99.3 F (37.4 C) (Axillary)  Resp 18  Ht 5\' 5"  (1.651 m)  Wt 84.426 kg (186 lb 2 oz)  BMI 30.97 kg/m2  SpO2 98%    FHT:  FHR: 135 bpm, variability: moderate,  accelerations:  Present,  decelerations:  Absent UC:   regular, every 2-5 minutes SVE:  10/100/1, membranes intact  Labs: Lab Results  Component Value Date   WBC 10.2 11/25/2013   HGB 11.7* 11/25/2013   HCT 35.0* 11/25/2013   MCV 90.2 11/25/2013   PLT 284 11/25/2013    Assessment / Plan: Spontaneous labor, progressing normally Would like to eat popsicle and then try to push  Labor: Progressing normally Fetal Wellbeing:  Category I Pain Control:  Epidural Anticipated MOD:  NSVD  Marge Duncans 11/25/2013, 6:54 AM

## 2013-11-25 NOTE — Anesthesia Postprocedure Evaluation (Signed)
Anesthesia Post Note  Patient: Kayla Fernandez  Procedure(s) Performed: * No procedures listed *  Anesthesia type: Epidural  Patient location: Mother/Baby  Post pain: Pain level controlled  Post assessment: Post-op Vital signs reviewed  Last Vitals:  Filed Vitals:   11/25/13 1553  BP: 130/79  Pulse: 69  Temp: 36.6 C  Resp: 18    Post vital signs: Reviewed  Level of consciousness:alert  Complications: No apparent anesthesia complications

## 2013-11-25 NOTE — H&P (Signed)
Kayla Fernandez is a 27 y.o. G2P1001 female at [redacted]w[redacted]d by 13.5wk u/s, presenting in active labor.  Reports active fetal movement, contractions: regular, every 3-5 minutes, vaginal bleeding: none, membranes: intact.  PNCare at [facility] since 15.1 wks. H/O uncomplicated term SVD in 2010, infant weighed 6lb11oz. SVE in office this week was 1.5/50/-2.   Prenatal History/Complications:  Past Medical History: Past Medical History  Diagnosis Date  . Medical history non-contributory     Past Surgical History: Past Surgical History  Procedure Laterality Date  . No past surgeries      Obstetrical History: OB History   Grav Para Term Preterm Abortions TAB SAB Ect Mult Living   2 1 1       1       Gynecological History: OB History   Grav Para Term Preterm Abortions TAB SAB Ect Mult Living   2 1 1       1       Social History: History   Social History  . Marital Status: Single    Spouse Name: N/A    Number of Children: N/A  . Years of Education: N/A   Social History Main Topics  . Smoking status: Never Smoker   . Smokeless tobacco: Never Used  . Alcohol Use: No  . Drug Use: No  . Sexual Activity: Yes    Birth Control/ Protection: None   Other Topics Concern  . None   Social History Narrative  . None    Family History: Family History  Problem Relation Age of Onset  . Stroke Other   . Birth defects Paternal Grandmother     ovarian     Allergies: No Known Allergies  Prescriptions prior to admission  Medication Sig Dispense Refill  . acetaminophen (TYLENOL) 500 MG tablet Take 500 mg by mouth as needed for pain.      . Calcium Carbonate Antacid (TUMS PO) Take by mouth as needed.      . prenatal vitamin w/FE, FA (PRENATAL 1 + 1) 27-1 MG TABS Take 1 tablet by mouth daily.         Review of Systems  Pertinent pos/neg as indicated in HPI    Blood pressure 133/81, pulse 97, temperature 98.3 F (36.8 C), temperature source Oral, height 5\' 5"  (1.651 m), weight 84.426 kg  (186 lb 2 oz). General appearance: alert, cooperative and no distress Lungs: clear to auscultation bilaterally Heart: regular rate and rhythm Abdomen: gravid, soft, non-tender Pelvic: 4/70/-2 upon arrival to MAU, walked x 1hr then 5/70/-2, vtx by MAU RN Extremities: tr edema DTR's 2+ Presentation: cephalic Fetal monitoring: Baseline: 145 bpm, Variability: moderate, Accelerations: present and Decelerations: Absent Uterine activity: Frequency: Every 3-5 minutes Dilation: 5 Effacement (%): 70 Station: -2 Exam by:: DHarris RN   Prenatal labs: ABO, Rh: A/POS/-- (06/23 1155) Antibody: NEG (09/15 0925) Rubella:  Immune RPR: NON REAC (09/15 0925)  HBsAg: NEGATIVE (06/23 1155)  HIV: NON REACTIVE (09/15 0925)  GBS: NEGATIVE (11/24 1017)  2 hr GTT normal: 67/102/83 Genetic screening  AFP neg Anatomy US normal female   Assessment:  [redacted]w[redacted]d SIUP  G2P1001  Active labor  Cat I FHR  GBS neg  Plan:  Admit to BS  IV pain meds/epidural prn active labor  Expectant management  Anticipate NSVD   Marge Duncans 11/25/2013, 12:26 AM

## 2013-11-26 ENCOUNTER — Encounter: Payer: BC Managed Care – PPO | Admitting: Women's Health

## 2013-11-26 LAB — CBC
Hemoglobin: 11.1 g/dL — ABNORMAL LOW (ref 12.0–15.0)
MCH: 29.9 pg (ref 26.0–34.0)
MCHC: 32.7 g/dL (ref 30.0–36.0)
MCV: 91.4 fL (ref 78.0–100.0)
Platelets: 272 10*3/uL (ref 150–400)
RBC: 3.71 MIL/uL — ABNORMAL LOW (ref 3.87–5.11)

## 2013-11-26 MED ORDER — TETANUS-DIPHTH-ACELL PERTUSSIS 5-2.5-18.5 LF-MCG/0.5 IM SUSP
0.5000 mL | Freq: Once | INTRAMUSCULAR | Status: AC
  Administered 2013-11-26: 0.5 mL via INTRAMUSCULAR
  Filled 2013-11-26: qty 0.5

## 2013-11-26 MED ORDER — IBUPROFEN 600 MG PO TABS: 600.0000 mg | ORAL_TABLET | Freq: Four times a day (QID) | ORAL | Status: DC

## 2013-11-26 NOTE — Discharge Summary (Signed)
Obstetric Discharge Summary Reason for Admission: onset of labor Prenatal Procedures: ultrasound Intrapartum Procedures: spontaneous vaginal delivery Postpartum Procedures: none Complications-Operative and Postpartum: periurethral and left labial laceration Hemoglobin  Date Value Range Status  11/26/2013 11.1* 12.0 - 15.0 g/dL Final     HCT  Date Value Range Status  11/26/2013 33.9* 36.0 - 46.0 % Final  Brief Hospital Course:  Kayla Fernandez is a 27 y.o. now G2P2002 presented in active labor, now PPD#1 s/p NSVD at [redacted]w[redacted]d. She had a viable baby girl, is bottle feeding and desires nexplanon for contraception at 6 week postpartum visit at [facility]. She is requesting discharge and is stable to be discharged today.   Physical Exam:  General: alert, cooperative and no distress Lochia: appropriate Uterine Fundus: firm Incision: n/a DVT Evaluation: No evidence of DVT seen on physical exam. Negative Homan's sign. No cords or calf tenderness. No significant calf/ankle edema.  Discharge Diagnoses: Term Pregnancy-delivered  Discharge Information: Date: 11/26/2013 Activity: pelvic rest Diet: routine Medications: PNV and Ibuprofen Condition: stable Instructions: refer to practice specific booklet Discharge to: home Follow-up Information   Follow up with [facility] In 6 weeks.   Specialty:  Obstetrics and Gynecology   Contact information:   [address] [place] [address] [phone]      Newborn Data: Live born female  Birth Weight: 7 lb 5.5 oz (3330 g) APGAR: 9, 9  Home with mother.  Hazeline Junker 11/26/2013, 7:54 AM  I spoke with and examined patient and agree with resident's note and plan of care.  Tawana Scale, MD OB Fellow 11/26/2013 7:59 AM

## 2013-11-28 ENCOUNTER — Other Ambulatory Visit: Payer: BC Managed Care – PPO | Admitting: Adult Health

## 2014-01-07 ENCOUNTER — Ambulatory Visit (INDEPENDENT_AMBULATORY_CARE_PROVIDER_SITE_OTHER): Payer: BC Managed Care – PPO | Admitting: Women's Health

## 2014-01-07 ENCOUNTER — Encounter: Payer: Self-pay | Admitting: Women's Health

## 2014-01-07 VITALS — BP 110/72 | Ht 65.0 in | Wt 170.5 lb

## 2014-01-07 DIAGNOSIS — Z309 Encounter for contraceptive management, unspecified: Secondary | ICD-10-CM

## 2014-01-07 DIAGNOSIS — Z3202 Encounter for pregnancy test, result negative: Secondary | ICD-10-CM

## 2014-01-07 DIAGNOSIS — Z3480 Encounter for supervision of other normal pregnancy, unspecified trimester: Secondary | ICD-10-CM

## 2014-01-07 LAB — POCT URINE PREGNANCY: PREG TEST UR: NEGATIVE

## 2014-01-07 MED ORDER — NORETHIN-ETH ESTRAD-FE BIPHAS 1 MG-10 MCG / 10 MCG PO TABS: 1.0000 | ORAL_TABLET | Freq: Every day | ORAL | Status: DC

## 2014-01-07 NOTE — Progress Notes (Signed)
Patient ID: Kayla Fernandez Lahm, female   DOB: April 01, 1986, 28 y.o.   MRN: [id number] Subjective:    Kayla Fernandez Proctor is a 28 y.o. 612P2002 Caucasian female who presents for a postpartum visit. She is 6 weeks postpartum following a spontaneous vaginal delivery at 40.0 gestational weeks. Anesthesia: epidural. I have fully reviewed the prenatal and intrapartum course. Postpartum course has been uncomplicated. Baby's course has been uncomplicated. Baby is feeding by bottle. Bleeding no bleeding. Bowel function is normal. Bladder function is normal. Patient is not sexually active. Contraception method is requests COCs. Postpartum depression screening: negative. Score 0.  Last pap June 2014 and was neg.  The following portions of the patient's history were reviewed and updated as appropriate: allergies, current medications, past medical history, past surgical history and problem list.  Review of Systems Pertinent items are noted in HPI.   Filed Vitals:   01/07/14 1121  BP: 110/72  Height: 5\' 5"  (1.651 m)  Weight: 170 lb 8 oz (77.338 kg)    Objective:     General:  alert, cooperative and no distress   Breasts:  deferred, no complaints  Lungs: clear to auscultation bilaterally  Heart:  regular rate and rhythm  Abdomen: soft, nontender   Vulva: normal  Vagina: normal vagina  Cervix:  closed  Corpus: Well-involuted  Adnexa:  Non-palpable  Rectal Exam: No hemorrhoids        Assessment:   Postpartum exam 6 wks s/p SVD Depression screening Contraception counseling   Plan:   Contraception: requests COCs Rx Lo LoEstrin w/ 11 RF Follow up in: 5 months for physical or as needed.   Marge DuncansBooker, Kimberly Randall CNM, [facility]NP-BC 01/07/2014 12:26 PM

## 2014-01-07 NOTE — Patient Instructions (Signed)
Ethinyl Estradiol; Norethindrone Acetate tablets (contraception)- Lo Loestrin What is this medicine? ETHINYL ESTRADIOL; NORETHINDRONE ACETATE (ETH in il es tra DYE ole; nor eth IN drone AS e tate) is an oral contraceptive. The products combine two types of female hormones, an estrogen and a progestin. They are used to prevent ovulation and pregnancy. This medicine may be used for other purposes; ask your health care provider or pharmacist if you have questions. COMMON BRAND NAME(S): Estrostep Fe, Gildess Fe 1.5/30, Gildess Fe 1/20, Gildess, Junel 1.5/30, Junel 1/20, Junel Fe 1.5/30, Junel Fe 1/20, Larin Fe, Sanford, Lo Loestrin Fe, Loestrin 1.5/30, Loestrin 1/20, Loestrin 24 Fe, Loestrin FE 1.5/30, Loestrin FE 1/20, Lomedia 24 Fe, Microgestin 1.5/30, Microgestin 1/20, Microgestin Fe 1.5/30, Microgestin Fe 1/20, Tilia Fe, Tri-Legest Fe What should I tell my health care provider before I take this medicine? They need to know if you have or ever had any of these conditions: -abnormal vaginal bleeding -blood vessel disease or blood clots -breast, cervical, endometrial, ovarian, liver, or uterine cancer -diabetes -gallbladder disease -heart disease or recent heart attack -high blood pressure -high cholesterol -kidney disease -liver disease -migraine headaches -stroke -systemic lupus erythematosus (SLE) -tobacco smoker -an unusual or allergic reaction to estrogens, progestins, other medicines, foods, dyes, or preservatives -pregnant or trying to get pregnant -breast-feeding How should I use this medicine? Take this medicine by mouth. To reduce nausea, this medicine may be taken with food. Follow the directions on the prescription label. Take this medicine at the same time each day and in the order directed on the package. Do not take your medicine more often than directed. Contact your pediatrician regarding the use of this medicine in children. Special care may be needed. This medicine has been  used in female children who have started having menstrual periods. A patient package insert for the product will be given with each prescription and refill. Read this sheet carefully each time. The sheet may change frequently. Overdosage: If you think you have taken too much of this medicine contact a poison control center or emergency room at once. NOTE: This medicine is only for you. Do not share this medicine with others. What if I miss a dose? If you miss a dose, refer to the patient information sheet you received with your medicine for direction. If you miss more than one pill, this medicine may not be as effective and you may need to use another form of birth control. What may interact with this medicine? -acetaminophen -antibiotics or medicines for infections, especially rifampin, rifabutin, rifapentine, and griseofulvin, and possibly penicillins or tetracyclines -aprepitant -ascorbic acid (vitamin C) -atorvastatin -barbiturate medicines, such as phenobarbital -bosentan -carbamazepine -caffeine -clofibrate -cyclosporine -dantrolene -doxercalciferol -felbamate -grapefruit juice -hydrocortisone -medicines for anxiety or sleeping problems, such as diazepam or temazepam -medicines for diabetes, including pioglitazone -mineral oil -modafinil -mycophenolate -nefazodone -oxcarbazepine -phenytoin -prednisolone -ritonavir or other medicines for HIV infection or AIDS -rosuvastatin -selegiline -soy isoflavones supplements -St. John's wort -tamoxifen or raloxifene -theophylline -thyroid hormones -topiramate -warfarin This list may not describe all possible interactions. Give your health care provider a list of all the medicines, herbs, non-prescription drugs, or dietary supplements you use. Also tell them if you smoke, drink alcohol, or use illegal drugs. Some items may interact with your medicine. What should I watch for while using this medicine? Visit your doctor or health  care professional for regular checks on your progress. You will need a regular breast and pelvic exam and Pap smear while on this medicine. Use  an additional method of contraception during the first cycle that you take these tablets. If you have any reason to think you are pregnant, stop taking this medicine right away and contact your doctor or health care professional. If you are taking this medicine for hormone related problems, it may take several cycles of use to see improvement in your condition. Smoking increases the risk of getting a blood clot or having a stroke while you are taking birth control pills, especially if you are more than 28 years old. You are strongly advised not to smoke. This medicine can make your body retain fluid, making your fingers, hands, or ankles swell. Your blood pressure can go up. Contact your doctor or health care professional if you feel you are retaining fluid. This medicine can make you more sensitive to the sun. Keep out of the sun. If you cannot avoid being in the sun, wear protective clothing and use sunscreen. Do not use sun lamps or tanning beds/booths. If you wear contact lenses and notice visual changes, or if the lenses begin to feel uncomfortable, consult your eye care specialist. In some women, tenderness, swelling, or minor bleeding of the gums may occur. Notify your dentist if this happens. Brushing and flossing your teeth regularly may help limit this. See your dentist regularly and inform your dentist of the medicines you are taking. If you are going to have elective surgery, you may need to stop taking this medicine before the surgery. Consult your health care professional for advice. This medicine does not protect you against HIV infection (AIDS) or any other sexually transmitted diseases. What side effects may I notice from receiving this medicine? Side effects that you should report to your doctor or health care professional as soon as  possible: -breast tissue changes or discharge -changes in vaginal bleeding during your period or between your periods -chest pain -coughing up blood -dizziness or fainting spells -headaches or migraines -leg, arm or groin pain -severe or sudden headaches -stomach pain (severe) -sudden shortness of breath -sudden loss of coordination, especially on one side of the body -speech problems -symptoms of vaginal infection like itching, irritation or unusual discharge -tenderness in the upper abdomen -vomiting -weakness or numbness in the arms or legs, especially on one side of the body -yellowing of the eyes or skin Side effects that usually do not require medical attention (report to your doctor or health care professional if they continue or are bothersome): -breakthrough bleeding and spotting that continues beyond the 3 initial cycles of pills -breast tenderness -mood changes, anxiety, depression, frustration, anger, or emotional outbursts -increased sensitivity to sun or ultraviolet light -nausea -skin rash, acne, or brown spots on the skin -weight gain (slight) This list may not describe all possible side effects. Call your doctor for medical advice about side effects. You may report side effects to FDA at 1-800-FDA-1088. Where should I keep my medicine? Keep out of the reach of children. Store at room temperature between 15 and 30 degrees C (59 and 86 degrees F). Throw away any unused medicine after the expiration date. NOTE: This sheet is a summary. It may not cover all possible information. If you have questions about this medicine, talk to your doctor, pharmacist, or health care provider.  2014, [employer]vier/Gold Standard. (2013-04-06 15:35:20)

## 2014-01-19 ENCOUNTER — Encounter: Payer: Self-pay | Admitting: Women's Health

## 2014-03-14 ENCOUNTER — Encounter: Payer: Self-pay | Admitting: Women's Health

## 2014-03-18 ENCOUNTER — Other Ambulatory Visit: Payer: Self-pay | Admitting: Women's Health

## 2014-03-18 MED ORDER — NORGESTIM-ETH ESTRAD TRIPHASIC 0.18/0.215/0.25 MG-25 MCG PO TABS: 1.0000 | ORAL_TABLET | Freq: Every day | ORAL | Status: DC

## 2014-10-14 ENCOUNTER — Encounter: Payer: Self-pay | Admitting: Women's Health

## 2015-02-18 ENCOUNTER — Telehealth: Payer: Self-pay | Admitting: Women's Health

## 2015-02-18 MED ORDER — NORGESTIM-ETH ESTRAD TRIPHASIC 0.18/0.215/0.25 MG-25 MCG PO TABS: 1.0000 | ORAL_TABLET | Freq: Every day | ORAL | Status: DC

## 2015-02-18 NOTE — Telephone Encounter (Signed)
Pt states at her PP visit last year Selena BattenKim told her she would not need to come back in for PE/PAP until 2017 and when it was time to renew BCP to have pharmacy contact us and she would approve it.  Pt states that she is completely out of pills and is supposed to start new pk today, I told her I would route a message to Selena BattenKim but usually we still see pts yearly even if pap is not needed.  Please advise.

## 2015-02-18 NOTE — Addendum Note (Signed)
Addended by: Cheral MarkerBOOKER, KIMBERLY R on: 02/18/2015 04:49 PM   Modules accepted: Orders

## 2015-02-18 NOTE — Telephone Encounter (Signed)
Pt informed that one month was sent to pharmacy but she will have to come in before any more refills are given.  Pt verbalized understanding.

## 2015-02-18 NOTE — Telephone Encounter (Signed)
I closed previous message by mistake instead of routing it to you!

## 2015-02-26 ENCOUNTER — Telehealth: Payer: Self-pay | Admitting: Adult Health

## 2015-02-26 NOTE — Telephone Encounter (Signed)
Called refill for tri sprintec in at 1 [phone]

## 2015-02-26 NOTE — Telephone Encounter (Signed)
Spoke with pt. Pt left her birth control at home [place]. She is [facility] Phone # is [phone]. Thanks!! JSY

## 2015-03-11 ENCOUNTER — Ambulatory Visit (INDEPENDENT_AMBULATORY_CARE_PROVIDER_SITE_OTHER): Payer: 59 | Admitting: Women's Health

## 2015-03-11 ENCOUNTER — Encounter: Payer: Self-pay | Admitting: Women's Health

## 2015-03-11 VITALS — BP 122/80 | HR 68 | Ht 65.0 in | Wt 172.0 lb

## 2015-03-11 DIAGNOSIS — Z01419 Encounter for gynecological examination (general) (routine) without abnormal findings: Secondary | ICD-10-CM | POA: Diagnosis not present

## 2015-03-11 DIAGNOSIS — Z308 Encounter for other contraceptive management: Secondary | ICD-10-CM

## 2015-03-11 MED ORDER — NORETHIN-ETH ESTRAD-FE BIPHAS 1 MG-10 MCG / 10 MCG PO TABS: 1.0000 | ORAL_TABLET | Freq: Every day | ORAL | Status: DC

## 2015-03-11 NOTE — Patient Instructions (Signed)
Oral Contraception Use Oral contraceptive pills (OCPs) are medicines taken to prevent pregnancy. OCPs work by preventing the ovaries from releasing eggs. The hormones in OCPs also cause the cervical mucus to thicken, preventing the sperm from entering the uterus. The hormones also cause the uterine lining to become thin, not allowing a fertilized egg to attach to the inside of the uterus. OCPs are highly effective when taken exactly as prescribed. However, OCPs do not prevent sexually transmitted diseases (STDs). Safe sex practices, such as using condoms along with an OCP, can help prevent STDs. Before taking OCPs, you may have a physical exam and Pap test. Your health care provider may also order blood tests if necessary. Your health care provider will make sure you are a good candidate for oral contraception. Discuss with your health care provider the possible side effects of the OCP you may be prescribed. When starting an OCP, it can take 2 to 3 months for the body to adjust to the changes in hormone levels in your body.  HOW TO TAKE ORAL CONTRACEPTIVE PILLS Your health care provider may advise you on how to start taking the first cycle of OCPs. Otherwise, you can:   Start on day 1 of your menstrual period. You will not need any backup contraceptive protection with this start time.   Start on the first Sunday after your menstrual period or the day you get your prescription. In these cases, you will need to use backup contraceptive protection for the first week.   Start the pill at any time of your cycle. If you take the pill within 5 days of the start of your period, you are protected against pregnancy right away. In this case, you will not need a backup form of birth control. If you start at any other time of your menstrual cycle, you will need to use another form of birth control for 7 days. If your OCP is the type called a minipill, it will protect you from pregnancy after taking it for 2 days (48  hours). After you have started taking OCPs:   If you forget to take 1 pill, take it as soon as you remember. Take the next pill at the regular time.   If you miss 2 or more pills, call your health care provider because different pills have different instructions for missed doses. Use backup birth control until your next menstrual period starts.   If you use a 28-day pack that contains inactive pills and you miss 1 of the last 7 pills (pills with no hormones), it will not matter. Throw away the rest of the non-hormone pills and start a new pill pack.  No matter which day you start the OCP, you will always start a new pack on that same day of the week. Have an extra pack of OCPs and a backup contraceptive method available in case you miss some pills or lose your OCP pack.  HOME CARE INSTRUCTIONS   Do not smoke.   Always use a condom to protect against STDs. OCPs do not protect against STDs.   Use a calendar to mark your menstrual period days.   Read the information and directions that came with your OCP. Talk to your health care provider if you have questions.  SEEK MEDICAL CARE IF:   You develop nausea and vomiting.   You have abnormal vaginal discharge or bleeding.   You develop a rash.   You miss your menstrual period.   You are losing   your hair.   You need treatment for mood swings or depression.   You get dizzy when taking the OCP.   You develop acne from taking the OCP.   You become pregnant.  SEEK IMMEDIATE MEDICAL CARE IF:   You develop chest pain.   You develop shortness of breath.   You have an uncontrolled or severe headache.   You develop numbness or slurred speech.   You develop visual problems.   You develop pain, redness, and swelling in the legs.  Document Released: 11/18/2011 Document Revised: 04/15/2014 Document Reviewed: 05/20/2013 ExitCare Patient Information 2015 [employer]. This information is not intended to replace  advice given to you by your health care provider. Make sure you discuss any questions you have with your health care provider.  

## 2015-03-11 NOTE — Progress Notes (Signed)
Patient ID: Kayla CuretShannon Fernandez, female   DOB: 02-Dec-1986, 29 y.o.   MRN: [id number] Subjective:   Kayla CuretShannon Fernandez is a 29 y.o. 512P2002 Caucasian female here for a routine well-woman exam.  Patient's last menstrual period was 02/14/2015.    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  Past Medical History Past Medical History  Diagnosis Date  . Medical history non-contributory     Past Surgical History Past Surgical History  Procedure Laterality Date  . No past surgeries      Gynecologic History G2P2002  Patient's last menstrual period was 02/14/2015. Contraception: OCP (estrogen/progesterone) Last Pap: 05/2013. Results were: normal Last mammogram: never. Results were: normal Last TCS: never  Obstetric History OB History  Gravida Para Term Preterm AB SAB TAB Ectopic Multiple Living  2 2 2       2     # Outcome Date GA Lbr Len/2nd Weight Sex Delivery Anes PTL Lv  2 Term 11/25/13 5480w0d 09:51 / 01:48 7 lb 5.5 oz (3.33 kg) F Vag-Spont EPI  Y  1 Term 10/09/09 6380w0d  6 lb 11 oz (3.033 kg) M Vag-Spont EPI  Y      Current Medications Current Outpatient Prescriptions on File Prior to Visit  Medication Sig Dispense Refill  . acetaminophen (TYLENOL) 500 MG tablet Take 500 mg by mouth as needed for pain.    . Norgestimate-Ethinyl Estradiol Triphasic 0.18/0.215/0.25 MG-25 MCG tab Take 1 tablet by mouth daily. 1 Package 0  . Calcium Carbonate  Antacid (TUMS PO) Take by mouth as needed.    Marland Kitchen. ibuprofen (ADVIL,MOTRIN) 600 MG tablet Take 1 tablet (600 mg total) by mouth every 6 (six) hours. (Patient not taking: Reported on 03/11/2015) 30 tablet 0  . Norethindrone-Ethinyl Estradiol-Fe Biphas (LO LOESTRIN FE) 1 MG-10 MCG / 10 MCG tablet Take 1 tablet by mouth daily. (Patient not taking: Reported on 03/11/2015) 1 Package 11  . prenatal vitamin w/FE, FA (PRENATAL 1 + 1) 27-1 MG TABS Take 1 tablet by mouth daily.     No current facility-administered medications on file prior to visit.    Review of Systems Patient denies any headaches, blurred vision, shortness of breath, chest pain, abdominal pain, problems with bowel movements, urination, or intercourse.  Objective:  BP 122/80 mmHg  Pulse 68  Ht 5\' 5"  (1.651 m)  Wt 172 lb (78.019 kg)  BMI 28.62 kg/m2  LMP 02/14/2015 Physical Exam  General:  Well developed, well nourished, no acute distress. She is alert and oriented x3. Skin:  Warm and dry Neck:  Midline trachea, no thyromegaly or nodules Cardiovascular: Regular rate and rhythm, no murmur heard Lungs:  Effort normal, all lung fields clear to auscultation bilaterally Breasts:  No dominant palpable mass, retraction, or nipple discharge Abdomen:  Soft, non tender, no hepatosplenomegaly or masses Pelvic:  External genitalia is normal in appearance.  The vagina is normal in appearance. The cervix is bulbous, no CMT.  Thin prep pap is not done neg 2014 Uterus is felt to be normal size,  shape, and contour.  No adnexal masses or tenderness noted. Extremities:  No swelling or varicosities noted Psych:  She has a normal mood and affect  Assessment:   Healthy well-woman exam Contraception management  Plan:  CBC, CMP, TSH today Refilled Lo Loestrin w/ 11RF F/U 54yr for pap & physical, or sooner if needed Mammogram  or sooner if problems Colonoscopy  or sooner if problems  Marge Duncans CNM, [facility] 03/11/2015 3:22  PM

## 2015-03-12 LAB — CBC
HCT: 39.6 % (ref 34.0–46.6)
HEMOGLOBIN: 12.9 g/dL (ref 11.1–15.9)
MCH: 29.9 pg (ref 26.6–33.0)
MCHC: 32.6 g/dL (ref 31.5–35.7)
MCV: 92 fL (ref 79–97)
Platelets: 413 10*3/uL — ABNORMAL HIGH (ref 150–379)
RBC: 4.32 x10E6/uL (ref 3.77–5.28)
RDW: 13.7 % (ref 12.3–15.4)
WBC: 7 10*3/uL (ref 3.4–10.8)

## 2015-03-12 LAB — COMPREHENSIVE METABOLIC PANEL
ALK PHOS: 82 IU/L (ref 39–117)
ALT: 10 IU/L (ref 0–32)
AST: 11 IU/L (ref 0–40)
Albumin/Globulin Ratio: 1.3 (ref 1.1–2.5)
Albumin: 4 g/dL (ref 3.5–5.5)
BUN / CREAT RATIO: 15 (ref 8–20)
BUN: 10 mg/dL (ref 6–20)
Bilirubin Total: <0.2 mg/dL (ref 0.0–1.2)
CO2: 23 mmol/L (ref 18–29)
Calcium: 9 mg/dL (ref 8.7–10.2)
Chloride: 102 mmol/L (ref 97–108)
Creatinine, Ser: 0.65 mg/dL (ref 0.57–1.00)
GFR calc non Af Amer: 121 mL/min/{1.73_m2} (ref >59)
GFR, EST AFRICAN AMERICAN: 140 mL/min/{1.73_m2} (ref >59)
Globulin, Total: 3 g/dL (ref 1.5–4.5)
Glucose: 90 mg/dL (ref 65–99)
POTASSIUM: 4.5 mmol/L (ref 3.5–5.2)
SODIUM: 139 mmol/L (ref 134–144)
Total Protein: 7 g/dL (ref 6.0–8.5)

## 2015-03-12 LAB — TSH: TSH: 2.45 u[IU]/mL (ref 0.450–4.500)

## 2015-03-17 ENCOUNTER — Telehealth: Payer: Self-pay | Admitting: Women's Health

## 2015-03-17 NOTE — Telephone Encounter (Signed)
Pt states we sent in RX for Lo Loestrin and she no longer takes that, she was switched to Sprintec and [facility] [place].

## 2015-03-18 MED ORDER — NORGESTIM-ETH ESTRAD TRIPHASIC 0.18/0.215/0.25 MG-25 MCG PO TABS: 1.0000 | ORAL_TABLET | Freq: Every day | ORAL | Status: DC

## 2015-03-19 ENCOUNTER — Other Ambulatory Visit: Payer: Self-pay | Admitting: Women's Health

## 2015-03-19 MED ORDER — NORGESTIMATE-ETH ESTRADIOL 0.25-35 MG-MCG PO TABS: 1.0000 | ORAL_TABLET | Freq: Every day | ORAL | Status: DC

## 2015-03-19 NOTE — Addendum Note (Signed)
Addended by: Cheral MarkerBOOKER, Jaysha Lasure R on: 03/19/2015 09:11 AM   Modules accepted: Orders, Medications

## 2016-02-16 ENCOUNTER — Other Ambulatory Visit: Payer: Self-pay | Admitting: Women's Health

## 2016-02-16 ENCOUNTER — Telehealth: Payer: Self-pay | Admitting: *Deleted

## 2016-02-16 MED ORDER — NORGESTIMATE-ETH ESTRADIOL 0.25-35 MG-MCG PO TABS: 1.0000 | ORAL_TABLET | Freq: Every day | ORAL | Status: DC

## 2016-02-16 NOTE — Telephone Encounter (Signed)
Pt requesting refill on Sprintec. Pt informed due for P&P March 13, 2016. Pt to call back to schedule that appt.

## 2016-03-05 ENCOUNTER — Telehealth: Payer: Self-pay | Admitting: *Deleted

## 2016-03-08 NOTE — Telephone Encounter (Signed)
Pt requesting refill for Sprintec, appt for pap scheduled for 03/22/2016.

## 2016-03-09 MED ORDER — NORGESTIMATE-ETH ESTRADIOL 0.25-35 MG-MCG PO TABS: 1.0000 | ORAL_TABLET | Freq: Every day | ORAL | Status: DC

## 2016-03-15 ENCOUNTER — Other Ambulatory Visit: Payer: Self-pay | Admitting: Women's Health

## 2016-03-22 ENCOUNTER — Ambulatory Visit (INDEPENDENT_AMBULATORY_CARE_PROVIDER_SITE_OTHER): Payer: BLUE CROSS/BLUE SHIELD | Admitting: Women's Health

## 2016-03-22 ENCOUNTER — Encounter: Payer: Self-pay | Admitting: Women's Health

## 2016-03-22 ENCOUNTER — Other Ambulatory Visit (HOSPITAL_COMMUNITY)
Admission: RE | Admit: 2016-03-22 | Discharge: 2016-03-22 | Disposition: A | Discharge status: Home / Self Care | Payer: BLUE CROSS/BLUE SHIELD | Source: Ambulatory Visit | Attending: Obstetrics & Gynecology | Admitting: Obstetrics & Gynecology

## 2016-03-22 VITALS — BP 120/88 | HR 60 | Ht 65.0 in | Wt 169.0 lb

## 2016-03-22 DIAGNOSIS — Z01419 Encounter for gynecological examination (general) (routine) without abnormal findings: Secondary | ICD-10-CM

## 2016-03-22 DIAGNOSIS — Z1151 Encounter for screening for human papillomavirus (HPV): Secondary | ICD-10-CM | POA: Insufficient documentation

## 2016-03-22 MED ORDER — NORGESTIMATE-ETH ESTRADIOL 0.25-35 MG-MCG PO TABS: 1.0000 | ORAL_TABLET | Freq: Every day | ORAL | Status: DC

## 2016-03-22 NOTE — Progress Notes (Signed)
Patient ID: Kayla Fernandez Siravo, female   DOB: 19-Mar-1986, 30 y.o.   MRN: [id number] Subjective:   Kayla Fernandez Dangerfield is a 30 y.o. 272P2002 Caucasian female here for a routine well-woman exam.  Patient's last menstrual period was 03/17/2016.    Current complaints: none PCP: [facility]       Does not desire labs or STI screening  Social History: Sexual: heterosexual Marital Status: dating Living situation: w/ boyfriend and children Occupation: [employer] in [place], childcare Tobacco/alcohol: no tobacco, etoh: none Illicit drugs: no history of illicit drug use  The following portions of the patient's history were reviewed and updated as appropriate: allergies, current medications, past family history, past medical history, past social history, past surgical history and problem list.  Past Medical History Past Medical History  Diagnosis Date  . Medical history non-contributory     Past Surgical History Past Surgical History  Procedure Laterality Date  . No past surgeries      Gynecologic History G2P2002  Patient's last menstrual period was 03/17/2016. Contraception: sprintec Last Pap: 2014. Results were: normal Last mammogram: never. Results were: n/a Last TCS: never  Obstetric History OB History  Gravida Para Term Preterm AB SAB TAB Ectopic Multiple Living  2 2 2       2     # Outcome Date GA Lbr Len/2nd Weight Sex Delivery Anes PTL Lv  2 Term 11/25/13 1961w0d 09:51 / 01:48 7 lb 5.5 oz (3.33 kg) F Vag-Spont EPI  Y  1 Term 10/09/09 5961w0d  6 lb 11 oz (3.033 kg) M Vag-Spont EPI  Y      Current Medications Current Outpatient Prescriptions on File Prior to Visit  Medication Sig Dispense Refill  . SPRINTEC 28 0.25-35 MG-MCG tablet TAKE ONE TABLET BY MOUTH ONCE DAILY 28 tablet 0   No current facility-administered medications on file prior to visit.    Review of Systems Patient denies any headaches, blurred vision, shortness of breath, chest pain, abdominal pain, problems  with bowel movements, urination, or intercourse.  Objective:  BP 120/88 mmHg  Pulse 60  Ht 5\' 5"  (1.651 m)  Wt 169 lb (76.658 kg)  BMI 28.12 kg/m2  LMP 03/17/2016 Physical Exam  General:  Well developed, well nourished, no acute distress. She is alert and oriented x3. Skin:  Warm and dry Neck:  Midline trachea, no thyromegaly or nodules Cardiovascular: Regular rate and rhythm, no murmur heard Lungs:  Effort normal, all lung fields clear to auscultation bilaterally Breasts:  No dominant palpable mass, retraction, or nipple discharge Abdomen:  Soft, non tender, no hepatosplenomegaly or masses Pelvic:  External genitalia is normal in appearance.  The vagina is normal in appearance. The cervix is bulbous, no CMT.  Thin prep pap is done w/ reflex HR HPV cotesting. Uterus is felt to be normal size, shape, and contour.  No adnexal masses or tenderness noted. Extremities:  No swelling or varicosities noted Psych:  She has a normal mood and affect  Assessment:   Healthy well-woman exam Contraception management  Plan:  Refilled sprintec w/ 11RF F/U 503yr for physical, or sooner if needed Mammogram @30yo  or sooner if problems Colonoscopy @30yo  or sooner if problems  Marge DuncansBooker, Kaysen Deal Randall CNM, WHNP-BC 03/22/2016 11:14 AM

## 2016-03-23 LAB — CYTOLOGY - PAP

## 2016-07-13 ENCOUNTER — Ambulatory Visit (INDEPENDENT_AMBULATORY_CARE_PROVIDER_SITE_OTHER): Payer: BLUE CROSS/BLUE SHIELD | Admitting: Women's Health

## 2016-07-13 ENCOUNTER — Encounter: Payer: Self-pay | Admitting: Women's Health

## 2016-07-13 VITALS — BP 122/78 | HR 68 | Wt 174.0 lb

## 2016-07-13 DIAGNOSIS — M79602 Pain in left arm: Secondary | ICD-10-CM

## 2016-07-13 DIAGNOSIS — M79622 Pain in left upper arm: Secondary | ICD-10-CM

## 2016-07-13 NOTE — Progress Notes (Signed)
   [facility] Visit  Patient name: Kayla Fernandez MRN [id number]  Date of birth: 1986/02/14  CC & HPI:  Kayla Fernandez is a 30 y.o. G28P2002 Caucasian female presenting today for report of pain/swelling under Lt axilla x 2 weeks. Cut out caffeine completely 1wk ago at the suggestion of a family member who went through same thing, and pain/swelling has completely resolved as of yesterday. Denies feeling any lumps/bumps/changes in breast. No family h/o breast CA.   Patient's last menstrual period was 07/02/2016. The current method of family planning is OCP (estrogen/progesterone). Last pap April 2017, neg  Pertinent History Reviewed:  Medical & Surgical Hx:   Past medical, surgical, family, and social history reviewed in electronic medical record Medications: Reviewed & Updated - see associated section Allergies: Reviewed in electronic medical record  Objective Findings:  Vitals: BP 122/78 (BP Location: Right Arm, Patient Position: Sitting, Cuff Size: Normal)   Pulse 68   Wt 174 lb (78.9 kg)   LMP 07/02/2016   BMI 28.96 kg/m  Body mass index is 28.96 kg/m.  Physical Examination: General appearance - alert, well appearing, and in no distress Breasts - breasts appear normal, no suspicious masses, no skin or nipple changes or axillary nodes Lt axilla: no tenderness to palpation, no lymph nodes or abnormalities noted, possible slight edema- but not really enough to notice   No results found for this or any previous visit (from the past 24 hour(s)).   Assessment & Plan:  A:   Resolved Lt axilla pain/swelling  P:  Keep caffeine out of diet  If pain/swelling resumes let me know and will order u/s  April for physical  Marge Duncans CNM, [facility] 07/13/2016 8:54 AM

## 2016-07-13 NOTE — Patient Instructions (Signed)
Call back if pain or swelling returns and we will get ultrasound Do not resume caffeine

## 2016-10-08 ENCOUNTER — Telehealth: Payer: Self-pay | Admitting: Women's Health

## 2016-10-08 ENCOUNTER — Encounter: Payer: Self-pay | Admitting: Women's Health

## 2016-10-08 NOTE — Telephone Encounter (Signed)
Pt's mom called stating that her daughter has found some more lumps under neath her arm. Pt's mom states that the pt is very scared and would like a call back from [place]. Please contact pt

## 2016-10-08 NOTE — Telephone Encounter (Signed)
Spoke with pt. Pt has a sore area under arm again. Kim advised if pain came back, may need US. Pt would like an US done. Thanks!! JSY

## 2016-10-11 NOTE — Telephone Encounter (Signed)
Spoke with pt. Pt has cut caffeine out of diet and thinks she feels a lump under arm. Kim, CNM advised she needs to be seen. Call transferred to front desk for appt. JSY

## 2016-10-12 ENCOUNTER — Encounter: Payer: Self-pay | Admitting: Women's Health

## 2016-10-12 ENCOUNTER — Ambulatory Visit (INDEPENDENT_AMBULATORY_CARE_PROVIDER_SITE_OTHER): Payer: BLUE CROSS/BLUE SHIELD | Admitting: Women's Health

## 2016-10-12 VITALS — BP 132/78 | HR 88 | Wt 178.0 lb

## 2016-10-12 DIAGNOSIS — M79622 Pain in left upper arm: Secondary | ICD-10-CM | POA: Diagnosis not present

## 2016-10-12 NOTE — Patient Instructions (Signed)
[facility] at 1[facility]20[facility], be there at 1:10pm. No lotion, powder, deoderant, or perfume that day
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
## 2016-10-13 LAB — CBC WITH DIFFERENTIAL
BASOS ABS: 0 10*3/uL (ref 0.0–0.2)
Basos: 0 %
EOS (ABSOLUTE): 0.2 10*3/uL (ref 0.0–0.4)
Eos: 3 %
Hematocrit: 36.7 % (ref 34.0–46.6)
Hemoglobin: 12.5 g/dL (ref 11.1–15.9)
IMMATURE GRANULOCYTES: 0 %
Immature Grans (Abs): 0 10*3/uL (ref 0.0–0.1)
LYMPHS ABS: 1.2 10*3/uL (ref 0.7–3.1)
Lymphs: 21 %
MCH: 30.6 pg (ref 26.6–33.0)
MCHC: 34.1 g/dL (ref 31.5–35.7)
MCV: 90 fL (ref 79–97)
MONOS ABS: 0.5 10*3/uL (ref 0.1–0.9)
Monocytes: 8 %
NEUTROS ABS: 3.9 10*3/uL (ref 1.4–7.0)
Neutrophils: 68 %
RBC: 4.08 x10E6/uL (ref 3.77–5.28)
RDW: 14.2 % (ref 12.3–15.4)
WBC: 5.8 10*3/uL (ref 3.4–10.8)

## 2016-10-26 ENCOUNTER — Ambulatory Visit (HOSPITAL_COMMUNITY)
Admission: RE | Admit: 2016-10-26 | Discharge: 2016-10-26 | Disposition: A | Discharge status: Home / Self Care | Payer: BLUE CROSS/BLUE SHIELD | Source: Ambulatory Visit | Attending: Women's Health | Admitting: Women's Health

## 2016-10-26 ENCOUNTER — Ambulatory Visit (HOSPITAL_COMMUNITY)
Admission: RE | Admit: 2016-10-26 | Discharge: 2016-10-26 | Disposition: A | Discharge status: Home / Self Care | Payer: BLUE CROSS/BLUE SHIELD | Source: Ambulatory Visit

## 2016-10-26 DIAGNOSIS — M79622 Pain in left upper arm: Secondary | ICD-10-CM

## 2017-03-24 ENCOUNTER — Encounter: Payer: Self-pay | Admitting: Women's Health

## 2017-03-24 ENCOUNTER — Ambulatory Visit (INDEPENDENT_AMBULATORY_CARE_PROVIDER_SITE_OTHER): Payer: BLUE CROSS/BLUE SHIELD | Admitting: Women's Health

## 2017-03-24 VITALS — BP 110/80 | HR 76 | Ht 67.0 in | Wt 169.0 lb

## 2017-03-24 DIAGNOSIS — Z01419 Encounter for gynecological examination (general) (routine) without abnormal findings: Secondary | ICD-10-CM | POA: Diagnosis not present

## 2017-03-24 MED ORDER — NORGESTIMATE-ETH ESTRADIOL 0.25-35 MG-MCG PO TABS: 1.0000 | ORAL_TABLET | Freq: Every day | ORAL | 11 refills | Status: DC

## 2017-03-24 NOTE — Progress Notes (Signed)
Subjective:   Kayla Fernandez is a 31 y.o. G70P2002 Caucasian female here for a routine well-woman exam.  Patient's last menstrual period was 03/18/2017.    Current complaints: none PCP: [facility]       Does not desire labs  Social History: Sexual: heterosexual Marital Status: dating Living situation: w/ boyfriend and her children Occupation: [employer] Tobacco/alcohol: none Illicit drugs: no history of illicit drug use  The following portions of the patient's history were reviewed and updated as appropriate: allergies, current medications, past family history, past medical history, past social history, past surgical history and problem list.  Past Medical History Past Medical History:  Diagnosis Date  . Medical history non-contributory     Past Surgical History Past Surgical History:  Procedure Laterality Date  . NO PAST SURGERIES      Gynecologic History [id number]  Patient's last menstrual period was 03/18/2017. Contraception: OCP (estrogen/progesterone), needs refill Last Pap: 03/22/16. Results were: normal Last mammogram: 10/26/16. Results were: normal, was having Lt axillary pain Last TCS: never  Obstetric History OB History  Gravida Para Term Preterm AB Living  SAB TAB Ectopic Multiple Live Births          2    # Outcome Date GA Lbr Len/2nd Weight Sex Delivery Anes PTL Lv  2 Term 11/25/13 [redacted]w[redacted]d 09:51 / 01:48 7 lb 5.5 oz (3.33 kg) F Vag-Spont EPI  LIV  1 Term 10/09/09 [redacted]w[redacted]d  6 lb 11 oz (3.033 kg) M Vag-Spont EPI  LIV      Current Medications Current Outpatient Prescriptions on File Prior to Visit  Medication Sig Dispense Refill  . norgestimate-ethinyl estradiol (SPRINTEC 28) 0.25-35 MG-MCG tablet Take 1 tablet by mouth daily. 28 tablet 11   No current facility-administered medications on file prior to visit.     Review of Systems Patient denies any headaches, blurred vision, shortness of breath, chest pain,  abdominal pain, problems with bowel movements, urination, or intercourse.  Objective:  BP 110/80   Pulse 76   Ht  (1.702 m)   Wt 169 lb (76.7 kg)   LMP 03/18/2017   BMI 26.47 kg/m  Physical Exam  General:  Well developed, well nourished, no acute distress. She is alert and oriented x3. Skin:  Warm and dry Neck:  Midline trachea, no thyromegaly or nodules Cardiovascular: Regular rate and rhythm, no murmur heard Lungs:  Effort normal, all lung fields clear to auscultation bilaterally Breasts:  No dominant palpable mass, retraction, or nipple discharge Abdomen:  Soft, non tender, no hepatosplenomegaly or masses Pelvic:  External genitalia is normal in appearance.  The vagina is normal in appearance. The cervix is bulbous, no CMT.  Thin prep pap is not done. Uterus is felt to be normal size, shape, and contour.  No adnexal masses or tenderness noted. Extremities:  No swelling or varicosities noted Psych:  She has a normal mood and affect  Assessment:   Healthy well-woman exam Contraception management  Plan:  Refilled sprintec w/ 11RF F/U 41yr for physical, or sooner if needed Mammogram  or sooner if problems Colonoscopy  or sooner if problems  Marge Duncans CNM, [facility] 03/24/2017 8:55 AM

## 2018-03-12 ENCOUNTER — Other Ambulatory Visit: Payer: Self-pay | Admitting: Women's Health

## 2018-04-06 ENCOUNTER — Telehealth: Payer: Self-pay | Admitting: Obstetrics & Gynecology

## 2018-04-06 MED ORDER — NORGESTIMATE-ETH ESTRADIOL 0.25-35 MG-MCG PO TABS: 1.0000 | ORAL_TABLET | Freq: Every day | ORAL | 0 refills | Status: DC

## 2018-04-06 NOTE — Telephone Encounter (Signed)
Pt needs refill on birth control until she comes to her annual on 5/1.

## 2018-04-07 ENCOUNTER — Other Ambulatory Visit: Payer: BLUE CROSS/BLUE SHIELD | Admitting: Women's Health

## 2018-04-12 ENCOUNTER — Encounter: Payer: Self-pay | Admitting: Women's Health

## 2018-04-12 ENCOUNTER — Ambulatory Visit (INDEPENDENT_AMBULATORY_CARE_PROVIDER_SITE_OTHER): Payer: BLUE CROSS/BLUE SHIELD | Admitting: Women's Health

## 2018-04-12 ENCOUNTER — Other Ambulatory Visit: Payer: Self-pay

## 2018-04-12 VITALS — BP 102/58 | HR 81 | Ht 65.0 in | Wt 173.0 lb

## 2018-04-12 DIAGNOSIS — R5383 Other fatigue: Secondary | ICD-10-CM | POA: Diagnosis not present

## 2018-04-12 DIAGNOSIS — Z01411 Encounter for gynecological examination (general) (routine) with abnormal findings: Secondary | ICD-10-CM | POA: Diagnosis not present

## 2018-04-12 DIAGNOSIS — Z01419 Encounter for gynecological examination (general) (routine) without abnormal findings: Secondary | ICD-10-CM

## 2018-04-12 NOTE — Progress Notes (Signed)
   WELL-WOMAN EXAMINATION Patient name: Kayla Fernandez MRN [id number]  Date of birth: [birth year] Chief Complaint:   Gynecologic Exam  History of Present Illness:   Kayla Fernandez is a 32 y.o. G14P2002 Caucasian female being seen today for a routine well-woman exam.  Current complaints: fatigue  PCP: none      does desire labs, CBC, TSH, Vit D Patient's last menstrual period was 04/08/2018. The current method of family planning is OCP (estrogen/progesterone) Last pap 2017. Results were: normal Last mammogram: 10/26/16. Results were: normal Last colonoscopy: never. Results were: n/a  Review of Systems:   Pertinent items are noted in HPI Denies any headaches, blurred vision, fatigue, shortness of breath, chest pain, abdominal pain, abnormal vaginal discharge/itching/odor/irritation, problems with periods, bowel movements, urination, or intercourse unless otherwise stated above. Pertinent History Reviewed:  Reviewed past medical,surgical, social and family history.  Reviewed problem list, medications and allergies. Physical Assessment:   Vitals:   04/12/18 1348  BP: (!) 102/58  Pulse: 81  Weight: 173 lb (78.5 kg)  Height:  (1.651 m)  Body mass index is 28.79 kg/m.        Physical Examination:   General appearance - well appearing, and in no distress  Mental status - alert, oriented to person, place, and time  Psych:  She has a normal mood and affect  Skin - warm and dry, normal color, no suspicious lesions noted  Chest - effort normal, all lung fields clear to auscultation bilaterally  Heart - normal rate and regular rhythm  Neck:  midline trachea, no thyromegaly or nodules  Breasts - breasts appear normal, no suspicious masses, no skin or nipple changes or  axillary nodes  Abdomen - soft, nontender, nondistended, no masses or organomegaly  Pelvic - VULVA: normal appearing vulva with no masses, tenderness or lesions  VAGINA: normal appearing vagina with normal color and  discharge, no lesions  CERVIX: normal appearing cervix without discharge or lesions, no CMT  Thin prep pap is not done   UTERUS: uterus is felt to be normal size, shape, consistency and nontender   ADNEXA: No adnexal masses or tenderness noted.  Extremities:  No swelling or varicosities noted  No results found for this or any previous visit (from the past 24 hour(s)).  Assessment & Plan:  1) Well-Woman Exam  2) Fatigue> check cbc, tsh, vit D  Labs/procedures today: as above  Mammogram  or sooner if problems Colonoscopy  or sooner if problems  Orders Placed This Encounter  Procedures  . CBC  . TSH  . Vitamin D (25 hydroxy)    Follow-up: Return in about 1 year (around 04/13/2019) for Pap & physical.  Cheral Marker CNM, WHNP-BC 04/12/2018 2:20 PM

## 2018-04-13 LAB — VITAMIN D 25 HYDROXY (VIT D DEFICIENCY, FRACTURES): VIT D 25 HYDROXY: 30.4 ng/mL (ref 30.0–100.0)

## 2018-04-13 LAB — CBC
Hematocrit: 37.3 % (ref 34.0–46.6)
Hemoglobin: 12.5 g/dL (ref 11.1–15.9)
MCH: 30.6 pg (ref 26.6–33.0)
MCHC: 33.5 g/dL (ref 31.5–35.7)
MCV: 91 fL (ref 79–97)
PLATELETS: 380 10*3/uL — AB (ref 150–379)
RBC: 4.09 x10E6/uL (ref 3.77–5.28)
RDW: 14 % (ref 12.3–15.4)
WBC: 7.3 10*3/uL (ref 3.4–10.8)

## 2018-04-13 LAB — TSH: TSH: 1.62 u[IU]/mL (ref 0.450–4.500)

## 2018-05-09 ENCOUNTER — Other Ambulatory Visit: Payer: Self-pay | Admitting: Women's Health

## 2018-05-09 ENCOUNTER — Telehealth: Payer: Self-pay | Admitting: Women's Health

## 2018-05-09 NOTE — Telephone Encounter (Signed)
Pt called and went to get her Birth control and there are no refills / She is out and needs to take a pill today/ Thank You

## 2019-02-27 ENCOUNTER — Telehealth: Payer: Self-pay | Admitting: *Deleted

## 2019-02-27 ENCOUNTER — Telehealth: Payer: Self-pay | Admitting: Women's Health

## 2019-02-27 MED ORDER — NORGESTIMATE-ETH ESTRADIOL 0.25-35 MG-MCG PO TABS: 1.0000 | ORAL_TABLET | Freq: Every day | ORAL | 0 refills | Status: DC

## 2019-02-27 NOTE — Telephone Encounter (Signed)
LMOVM that request for refill on birth control had been sent to provider. Advised to check with pharmacy later today.

## 2019-02-27 NOTE — Telephone Encounter (Signed)
Patient called stating that she would like for Kim to call her in a [facility] refill. Pt states that she does not want to make an appointment for PAP as of yet due to the virus. Please contact pt

## 2019-05-30 ENCOUNTER — Telehealth: Payer: Self-pay | Admitting: Adult Health

## 2019-05-30 MED ORDER — NORGESTIMATE-ETH ESTRADIOL 0.25-35 MG-MCG PO TABS: 1.0000 | ORAL_TABLET | Freq: Every day | ORAL | 0 refills | Status: DC

## 2019-05-30 NOTE — Telephone Encounter (Signed)
Patient called and scheduled a pap/physical for 07/19/19 with [facility].  She needs a refill on her bc.  [facility] [place]  [phone]

## 2019-05-30 NOTE — Telephone Encounter (Signed)
Refilled OCs 

## 2019-07-19 ENCOUNTER — Other Ambulatory Visit: Payer: Self-pay

## 2019-07-19 ENCOUNTER — Encounter: Payer: Self-pay | Admitting: Adult Health

## 2019-07-19 ENCOUNTER — Ambulatory Visit (INDEPENDENT_AMBULATORY_CARE_PROVIDER_SITE_OTHER): Payer: BLUE CROSS/BLUE SHIELD | Admitting: Adult Health

## 2019-07-19 ENCOUNTER — Other Ambulatory Visit (HOSPITAL_COMMUNITY)
Admission: RE | Admit: 2019-07-19 | Discharge: 2019-07-19 | Disposition: A | Discharge status: Home / Self Care | Payer: BLUE CROSS/BLUE SHIELD | Source: Ambulatory Visit | Attending: Adult Health | Admitting: Adult Health

## 2019-07-19 VITALS — BP 127/84 | HR 81 | Ht 64.0 in | Wt 174.0 lb

## 2019-07-19 DIAGNOSIS — Z01419 Encounter for gynecological examination (general) (routine) without abnormal findings: Secondary | ICD-10-CM | POA: Insufficient documentation

## 2019-07-19 DIAGNOSIS — Z3041 Encounter for surveillance of contraceptive pills: Secondary | ICD-10-CM | POA: Insufficient documentation

## 2019-07-19 MED ORDER — NORGESTIMATE-ETH ESTRADIOL 0.25-35 MG-MCG PO TABS: 1.0000 | ORAL_TABLET | Freq: Every day | ORAL | 4 refills | Status: DC

## 2019-07-19 NOTE — Progress Notes (Signed)
Patient ID: Kayla Fernandez, female   DOB: 13-Jan-1986, 33 y.o.   MRN: [id number] History of Present Illness: Kayla Fernandez is a 33 year old white female, single, G2P2, in for a well woman gyn exam and pap. PCP is [facility], but she has not seen lately.   Current Medications, Allergies, Past Medical History, Past Surgical History, Family History and Social History were reviewed in [employer] record.     Review of Systems:  Patient denies any headaches, hearing loss, fatigue, blurred vision, shortness of breath, chest pain, abdominal pain, problems with bowel movements, urination, or intercourse. No joint pain or mood swings.   Physical Exam:BP 127/84 (BP Location: Left Arm, Patient Position: Sitting, Cuff Size: Normal)   Pulse 81   Ht 5\' 4"  (1.626 m)   Wt 174 lb (78.9 kg)   LMP 06/29/2019   BMI 29.87 kg/m  General:  Well developed, well nourished, no acute distress Skin:  Warm and dry Neck:  Midline trachea, normal thyroid, good ROM, no lymphadenopathy Lungs; Clear to auscultation bilaterally Breast:  No dominant palpable mass, retraction, or nipple discharge Cardiovascular: Regular rate and rhythm Abdomen:  Soft, non tender, no hepatosplenomegaly Pelvic:  External genitalia is normal in appearance, no lesions.  The vagina is normal in appearance. Urethra has no lesions or masses. The cervix is bulbous, everted at os, pap with HPV with 16/18 reflex genotyping.   Uterus is felt to be normal size, shape, and contour.  No adnexal masses or tenderness noted.Bladder is non tender, no masses felt. Extremities/musculoskeletal:  No swelling or varicosities noted, no clubbing or cyanosis Psych:  No mood changes, alert and cooperative,seems happy Fall risk is low PHQ 2 score 0. Examination chaperoned and co exam by Jarvis Morgan, FNP student.  Impression: 1. Encounter for gynecological examination with Papanicolaou smear of cervix   2. Encounter for surveillance of  contraceptive pills       Plan: Will continue OCs Meds ordered this encounter  Medications  . norgestimate-ethinyl estradiol (SPRINTEC 28) 0.25-35 MG-MCG tablet    Sig: Take 1 tablet by mouth daily.    Dispense:  3 Package    Refill:  4    Please consider 90 day supplies to promote better adherence    Order Specific Question:   Supervising Provider    Answer:   Florian Buff [[id number]]  Physical in 1 year Pap in 3 if normal

## 2019-07-23 LAB — CYTOLOGY - PAP
Diagnosis: NEGATIVE
HPV: NOT DETECTED

## 2020-07-30 ENCOUNTER — Other Ambulatory Visit: Payer: Self-pay | Admitting: Adult Health

## 2021-12-13 NOTE — L&D Delivery Note (Signed)
OB/[facility]N Faculty Practice Delivery Note  Kayla Fernandez is a 36 y.o. G3P3003 s/p SVD at [redacted]w[redacted]d. She was admitted for IOL for cHTN.   ROM: 1h 34m with clear fluid GBS Status: GBS neg Maximum Maternal Temperature: 97.6  Labor Progress: IOL begun with buccal cytotec, pt progressed to 4cm with SROM then got epidural and rapidly progressed to complete with urge to push.  Delivery Date/Time: 06/11/22 at 1919 Delivery: Called to room and patient was complete, fetal heart tones in the 70s. Instructed patient to push but noted lack of maternal effort and bradycardia into the 50s. Pt instructed firmly to push, rolled to her right side and Dr. Para March called to bedside for possible vacuum delivery. Pt pushed with better effort and head delivered ROA. Nuchal cord and hand present. Shoulder and body delivered in usual fashion. Infant stimulated strongly for spontaneous cry and placed on mother's abdomen, dried and stimulated. Cord clamped x 2 after 2-minute delay, and cut by FOB. Cord blood drawn. Placenta delivered spontaneously, intact, with 3-vessel cord. Fundus firm with massage and Pitocin. Labia, perineum, vagina, and cervix inspected, small hemostatic periurethral abrasion found, no repair needed.   Placenta: Intact, spontaneous to L&D Complications: None Lacerations: Small periurethral EBL: 100 Analgesia: Epidural  Postpartum Planning [x]  transfer orders to MB [x]  discharge summary started & shared [x]  message to sent to schedule follow-up  [x]  lists updated [x]  vaccines UTD  Infant: Girl  APGARs 8/9  2770g  , CNM, IBCLC Certified Nurse Midwife, [facility] for , [facility] 06/11/2022, 9:14 PM

## 2021-12-21 ENCOUNTER — Other Ambulatory Visit: Payer: Self-pay

## 2021-12-21 ENCOUNTER — Ambulatory Visit (INDEPENDENT_AMBULATORY_CARE_PROVIDER_SITE_OTHER): Payer: BLUE CROSS/BLUE SHIELD | Admitting: *Deleted

## 2021-12-21 VITALS — BP 148/90 | HR 98 | Ht 65.0 in | Wt 190.0 lb

## 2021-12-21 DIAGNOSIS — N926 Irregular menstruation, unspecified: Secondary | ICD-10-CM

## 2021-12-21 DIAGNOSIS — Z3201 Encounter for pregnancy test, result positive: Secondary | ICD-10-CM

## 2021-12-21 LAB — POCT URINE PREGNANCY: Preg Test, Ur: POSITIVE — AB

## 2021-12-21 NOTE — Progress Notes (Signed)
° °  NURSE VISIT- PREGNANCY CONFIRMATION   SUBJECTIVE:  Kayla Fernandez is a 36 y.o. G31P2002 female at [redacted]w[redacted]d by certain LMP of Patient's last menstrual period was 08/29/2021. Here for pregnancy confirmation.  Home pregnancy test: positive x 2   She reports no complaints.  She is taking prenatal vitamins.    OBJECTIVE:  BP (!) 148/90 (BP Location: Right Arm, Patient Position: Sitting, Cuff Size: Normal)    Pulse 98    Ht 5\' 5"  (1.651 m)    Wt 190 lb (86.2 kg)    LMP 08/29/2021    BMI 31.62 kg/m   Appears well, in no apparent distress  Results for orders placed or performed in visit on 12/21/21 (from the past 24 hour(s))  POCT urine pregnancy   Collection Time: 12/21/21 10:54 AM  Result Value Ref Range   Preg Test, Ur Positive (A) Negative    ASSESSMENT: Positive pregnancy test, [redacted]w[redacted]d by LMP    PLAN: Schedule for dating ultrasound in ASAP  Prenatal vitamins: continue   Nausea medicines: not currently needed   OB packet given: Yes  Elexia Friedt  12/21/2021 11:00 AM

## 2021-12-21 NOTE — Progress Notes (Signed)
Chart reviewed for nurse visit. Agree with plan of care.  Adline Potter, NP 12/21/2021 11:52 AM

## 2021-12-24 ENCOUNTER — Other Ambulatory Visit (INDEPENDENT_AMBULATORY_CARE_PROVIDER_SITE_OTHER): Payer: BLUE CROSS/BLUE SHIELD

## 2021-12-24 ENCOUNTER — Other Ambulatory Visit: Payer: Self-pay

## 2021-12-24 VITALS — BP 151/94 | HR 94 | Wt 189.0 lb

## 2021-12-24 DIAGNOSIS — R399 Unspecified symptoms and signs involving the genitourinary system: Secondary | ICD-10-CM | POA: Diagnosis not present

## 2021-12-24 DIAGNOSIS — Z1389 Encounter for screening for other disorder: Secondary | ICD-10-CM

## 2021-12-24 DIAGNOSIS — I10 Essential (primary) hypertension: Secondary | ICD-10-CM

## 2021-12-24 HISTORY — DX: Essential (primary) hypertension: I10

## 2021-12-24 LAB — POCT URINALYSIS DIPSTICK OB
Glucose, UA: NEGATIVE
Ketones, UA: NEGATIVE
Nitrite, UA: NEGATIVE

## 2021-12-24 MED ORDER — LABETALOL HCL 200 MG PO TABS: 200.0000 mg | ORAL_TABLET | Freq: Two times a day (BID) | ORAL | 3 refills | Status: DC

## 2021-12-24 MED ORDER — SULFAMETHOXAZOLE-TRIMETHOPRIM 800-160 MG PO TABS: 1.0000 | ORAL_TABLET | Freq: Two times a day (BID) | ORAL | 0 refills | Status: DC

## 2021-12-24 MED ORDER — PRENATABS RX 29-1 MG PO TABS: 1.0000 | ORAL_TABLET | Freq: Every day | ORAL | 4 refills | Status: DC

## 2021-12-24 MED ORDER — ASPIRIN EC 81 MG PO TBEC: 162.0000 mg | DELAYED_RELEASE_TABLET | Freq: Every day | ORAL | 6 refills | Status: DC

## 2021-12-24 MED ORDER — BLOOD PRESSURE MONITOR AUTOMAT DEVI: 0 refills | Status: DC

## 2021-12-24 NOTE — Addendum Note (Signed)
Addended by: Jacklyn Shell on: 12/24/2021 01:15 PM   Modules accepted: Orders, Level of Service

## 2021-12-24 NOTE — Progress Notes (Addendum)
Kayla Fernandez  Patient name: Kayla Fernandez MRN [id number]  Date of birth: November 27, 1986  CC & HPI:  Kayla Fernandez is a 36 y.o. Caucasian female presenting today for UTI sx (see RN's note).  BP ^ last week   Pertinent History Reviewed:  Medical & Surgical Hx:   Past Medical History:  Diagnosis Date   Medical history non-contributory    Past Surgical History:  Procedure Laterality Date   NO PAST SURGERIES     Family History  Problem Relation Age of Onset   Birth defects Paternal Grandmother        ovarian    Transient ischemic attack Maternal Grandmother    Stroke Other     Current Outpatient Medications:    aspirin EC 81 MG tablet, Take 2 tablets (162 mg total) by mouth daily., Disp: 60 tablet, Rfl: 6   labetalol (NORMODYNE) 200 MG tablet, Take 1 tablet (200 mg total) by mouth 2 (two) times daily., Disp: 60 tablet, Rfl: 3   Prenatal Vit-Iron Carbonyl-FA (PRENATABS RX) 29-1 MG TABS, Take 1 tablet by mouth daily., Disp: 90 tablet, Rfl: 4   sulfamethoxazole-trimethoprim (BACTRIM DS) 800-160 MG tablet, Take 1 tablet by mouth 2 (two) times daily., Disp: 10 tablet, Rfl: 0 Social History: Reviewed -  reports that she has never smoked. She has never used smokeless tobacco.  Review of Systems:   Constitutional: Negative for fever and chills Eyes: Negative for visual disturbances Respiratory: Negative for shortness of breath, dyspnea Cardiovascular: Negative for chest pain or palpitations  Gastrointestinal: Negative for vomiting, diarrhea and constipation; no abdominal pain Genitourinary: Negative for vaginal irritation or itching Musculoskeletal: Negative for back pain, joint pain, myalgias  Neurological: Negative for dizziness and headaches    Objective Findings:    Physical Examination: Vitals:   12/24/21 1140  BP: (!) 151/94  Pulse: 94   BP 148/90 12/21/21 and 150/92 on recheck today    General appearance - well appearing, and in no distress Mental  status - alert, oriented to person, place, and time Chest:  Normal respiratory effort Heart - normal rate and regular rhythm Musculoskeletal:  Normal range of motion without pain Extremities:  No edema    Results for orders placed or performed in Fernandez on 12/24/21 (from the past 24 hour(s))  POC Urinalysis Dipstick OB   Collection Time: 12/24/21 11:31 AM  Result Value Ref Range   Color, UA     Clarity, UA     Glucose, UA Negative Negative   Bilirubin, UA     Ketones, UA neg    Spec Grav, UA     Blood, UA small    pH, UA     POC,PROTEIN,UA Trace Negative, Trace, Small (1+), Moderate (2+), Large (3+), 4+   Urobilinogen, UA     Nitrite, UA neg    Leukocytes, UA Trace (A) Negative   Appearance     Odor        Assessment & Plan:  A:   UTI  CHTN, dx today P:  Rx ASA 162mg , labetalol 200mg  BID.  BP cuff rx faxed (dad has one she can use until it arrives).    Septra DS BID X5   Return in about 11 days (around 01/04/2022).  Christin Fudge CNM 12/24/2021 1:12 PM      RN note:  SUBJECTIVE:  Kayla Fernandez is a 36 y.o. G66P2002 female here for UTI symptoms. She is [redacted]w[redacted]d pregnant. She reports hematuria, urinary frequency, and urinary  urgency.  OBJECTIVE:  BP (!) 151/94 (BP Location: Right Arm, Patient Position: Sitting, Cuff Size: Normal)    Pulse 94    Wt 189 lb (85.7 kg)    LMP 08/29/2021    BMI 31.45 kg/m   Appears well, in no apparent distress    ASSESSMENT: Pregnancy [redacted]w[redacted]d with UTI symptoms and negative nitrites  PLAN: Discussed with Nigel Berthold, CNM   Rx sent by provider today: Yes Urine culture sent Call or return to clinic prn if these symptoms worsen or fail to improve as anticipated. Follow-up: as scheduled   Lise Pincus A Evin Chirco  12/24/2021 11:40 AM

## 2021-12-27 LAB — URINE CULTURE

## 2022-01-01 ENCOUNTER — Other Ambulatory Visit: Payer: Self-pay | Admitting: Adult Health

## 2022-01-01 DIAGNOSIS — Z363 Encounter for antenatal screening for malformations: Secondary | ICD-10-CM

## 2022-01-04 ENCOUNTER — Ambulatory Visit (INDEPENDENT_AMBULATORY_CARE_PROVIDER_SITE_OTHER): Payer: BLUE CROSS/BLUE SHIELD

## 2022-01-04 ENCOUNTER — Other Ambulatory Visit: Payer: Self-pay | Admitting: Adult Health

## 2022-01-04 ENCOUNTER — Other Ambulatory Visit: Payer: Self-pay

## 2022-01-04 DIAGNOSIS — Z363 Encounter for antenatal screening for malformations: Secondary | ICD-10-CM

## 2022-01-04 DIAGNOSIS — Z3A16 16 weeks gestation of pregnancy: Secondary | ICD-10-CM | POA: Diagnosis not present

## 2022-01-04 NOTE — Progress Notes (Signed)
[place] 16+3 wks,breech,posterior right placenta gr 0,normal ovaries,FHR 144 bpm,cx 4 cm,SVP of fluid 4.3 cm,EFW 158 g,EDD 06/18/2022 by today's ultrasound,limited ultrasound because of fetal age,please have pt come back for anatomy ultrasound

## 2022-01-06 ENCOUNTER — Telehealth: Payer: Self-pay | Admitting: *Deleted

## 2022-01-06 ENCOUNTER — Other Ambulatory Visit: Payer: BLUE CROSS/BLUE SHIELD

## 2022-01-06 DIAGNOSIS — O0990 Supervision of high risk pregnancy, unspecified, unspecified trimester: Secondary | ICD-10-CM | POA: Insufficient documentation

## 2022-01-06 NOTE — Telephone Encounter (Signed)
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  She did not come in close contact with the child and washed her hands frequently throughout the day.  Currently does not have any symptoms herself. Discussed with Dr Nelda Marseille and offered for patient to have IGg and IGm antibodies checked to assess for immunity. Pt agreeable to labs.

## 2022-01-07 LAB — PARVOVIRUS B19 IGM: Parvovirus B19 IgM: 0.3 index (ref 0.0–0.8)

## 2022-01-07 LAB — PARVOVIRUS B19 IGG: Parvovirus B19 IgG: 4.3 index — ABNORMAL HIGH (ref 0.0–0.8)

## 2022-01-22 ENCOUNTER — Ambulatory Visit (INDEPENDENT_AMBULATORY_CARE_PROVIDER_SITE_OTHER): Payer: 59 | Admitting: Advanced Practice Midwife

## 2022-01-22 ENCOUNTER — Other Ambulatory Visit: Payer: Self-pay

## 2022-01-22 ENCOUNTER — Encounter: Payer: BLUE CROSS/BLUE SHIELD | Admitting: Advanced Practice Midwife

## 2022-01-22 ENCOUNTER — Encounter: Payer: Self-pay | Admitting: Advanced Practice Midwife

## 2022-01-22 VITALS — BP 118/72 | HR 79 | Wt 194.0 lb

## 2022-01-22 DIAGNOSIS — Z3A19 19 weeks gestation of pregnancy: Secondary | ICD-10-CM

## 2022-01-22 DIAGNOSIS — I10 Essential (primary) hypertension: Secondary | ICD-10-CM

## 2022-01-22 DIAGNOSIS — O0992 Supervision of high risk pregnancy, unspecified, second trimester: Secondary | ICD-10-CM

## 2022-01-22 DIAGNOSIS — O09522 Supervision of elderly multigravida, second trimester: Secondary | ICD-10-CM

## 2022-01-22 DIAGNOSIS — O132 Gestational [pregnancy-induced] hypertension without significant proteinuria, second trimester: Secondary | ICD-10-CM

## 2022-01-22 DIAGNOSIS — O09529 Supervision of elderly multigravida, unspecified trimester: Secondary | ICD-10-CM | POA: Insufficient documentation

## 2022-01-22 NOTE — Progress Notes (Signed)
INITIAL OBSTETRICAL VISIT Patient name: Kayla Fernandez MRN [id number]  Date of birth: 03-Jun-1986 Chief Complaint:   Initial Prenatal Visit  History of Present Illness:   Kayla Fernandez is a 36 y.o. G34P2002 Caucasian female at [redacted]w[redacted]d by [place] at 16.3 weeks with an Estimated Date of Delivery: 06/18/22 being seen today for her initial obstetrical visit.   Patient's last menstrual period was 08/29/2021. Her obstetrical history is significant for  term SVB x 2 (8 and 10 years ago) .   Today she reports no complaints.  Last pap Aug 2020. Results were:  neg  Depression screen [facility] 2/9 01/22/2022 07/19/2019 04/12/2018 03/24/2017  Decreased Interest 0 0 0 0  Down, Depressed, Hopeless 0 0 0 0  PHQ - 2 Score 0 0 0 0  Altered sleeping 0 - - -  Tired, decreased energy 0 - - -  Change in appetite 0 - - -  Feeling bad or failure about yourself  0 - - -  Trouble concentrating 0 - - -  Moving slowly or fidgety/restless 0 - - -  Suicidal thoughts 0 - - -  PHQ-9 Score 0 - - -     GAD 7 : Generalized Anxiety Score 01/22/2022  Nervous, Anxious, on Edge 0  Control/stop worrying 0  Worry too much - different things 0  Trouble relaxing 0  Restless 0  Easily annoyed or irritable 0  Afraid - awful might happen 0  Total GAD 7 Score 0     Review of Systems:   Pertinent items are noted in HPI Denies cramping/contractions, leakage of fluid, vaginal bleeding, abnormal vaginal discharge w/ itching/odor/irritation, headaches, visual changes, shortness of breath, chest pain, abdominal pain, severe nausea/vomiting, or problems with urination or bowel movements unless otherwise stated above.  Pertinent History Reviewed:  Reviewed past medical,surgical, social, obstetrical and family history.  Reviewed problem list, medications and allergies. OB History  Gravida Para Term Preterm AB Living  3 2 2     2   SAB IAB Ectopic Multiple Live Births          2    # Outcome Date GA Lbr Len/2nd Weight Sex Delivery Anes PTL  Lv  3 Current           2 Term 11/25/13 [redacted]w[redacted]d 09:51 / 01:48 7 lb 5.5 oz (3.33 kg) F Vag-Spont EPI N LIV  1 Term 10/09/09 [redacted]w[redacted]d  6 lb 11 oz (3.033 kg) M Vag-Spont EPI N LIV   Physical Assessment:   Vitals:   01/22/22 1030  BP: 118/72  Pulse: 79  Weight: 194 lb (88 kg)  Body mass index is 32.28 kg/m.       Physical Examination:  General appearance - well appearing, and in no distress  Mental status - alert, oriented to person, place, and time  Psych:  She has a normal mood and affect  Skin - warm and dry, normal color, no suspicious lesions noted  Chest - effort normal, all lung fields clear to auscultation bilaterally  Heart - normal rate and regular rhythm  Abdomen - soft, nontender; FHR 144bpm  Extremities:  No swelling or varicosities noted  Pelvic - not indicated  Thin prep pap is not done    TODAY'S NT too late  No results found for this or any previous visit (from the past 24 hour(s)).  Assessment & Plan:  1) High-Risk Pregnancy G3P2002 at [redacted]w[redacted]d with an Estimated Date of Delivery: 06/18/22   2) Initial OB visit  3)  cHTN, dx by BP elevations ~13/14wks and started on Labetalol 200mg  bid; no previous hx of hypertension; given bASA 162mg /day at that time as well  Meds: No orders of the defined types were placed in this encounter.   Initial labs obtained Continue prenatal vitamins Reviewed n/v relief measures and warning s/s to report Reviewed recommended weight gain based on pre-gravid BMI Encouraged well-balanced diet Genetic & carrier screening discussed: too late for NT/IT, requests AFP Ultrasound discussed; fetal survey: requested CCNC completed> form faxed if has or is planning to apply for medicaid The nature of [place] for [employer] with multiple MDs and other Advanced Practice Providers was explained to patient; also emphasized that fellows, residents, and students are part of our team. Does have home bp cuff. Office bp cuff given: no. Rx  sent: n/a. Check bp weekly, let us know if consistently >140/90.   Indications for ASA therapy (per uptodate) One of the following: CHTN Yes  Indications for early A1C (per uptodate) BMI >=25 (>=23 in Asian women) AND one of the following HTN or on therapy for hypertension Yes   Follow-up: Return for  4wks HROB. (Already has anatomy scan scheduled)  Orders Placed This Encounter  Procedures   GC/Chlamydia Probe Amp   AFP, Serum, Open Spina Bifida   Genetic Screening   CBC/D/Plt+RPR+Rh+ABO+RubIgG...   Comprehensive metabolic panel   Protein / creatinine ratio, urine   Hemoglobin A1c    Myrtis Ser [facility] 01/22/2022 12:26 PM

## 2022-01-22 NOTE — Patient Instructions (Signed)
Kayla Fernandez, thank you for choosing our office today! We appreciate the opportunity to meet your healthcare needs. You may receive a short survey by mail, e-mail, or through MyChart. If you are happy with your care we would appreciate if you could take just a few minutes to complete the survey questions. We read all of your comments and take your feedback very seriously. Thank you again for choosing our office.  [facility] ([address] [place], [place] [address]) Entrance C, located off of [address] Free 24/7 valet parking   Nausea & Vomiting Have saltine crackers or pretzels by your bed and eat a few bites before you raise your head out of bed in the morning Eat small frequent meals throughout the day instead of large meals Drink plenty of fluids throughout the day to stay hydrated, just don't drink a lot of fluids with your meals.  This can make your stomach fill up faster making you feel sick Do not brush your teeth right after you eat Products with real ginger are good for nausea, like ginger ale and ginger hard candy Make sure it says made with real ginger! Sucking on sour candy like lemon heads is also good for nausea If your prenatal vitamins make you nauseated, take them at night so you will sleep through the nausea Sea Bands If you feel like you need medicine for the nausea & vomiting please let us know If you are unable to keep any fluids or food down please let us know   Constipation Drink plenty of fluid, preferably water, throughout the day Eat foods high in fiber such as fruits, vegetables, and grains Exercise, such as walking, is a good way to keep your bowels regular Drink warm fluids, especially warm prune juice, or decaf coffee Eat a 1/2 cup of real oatmeal (not instant), 1/2 cup applesauce, and 1/2-1 cup warm prune juice every day If needed, you may take Colace (docusate sodium) stool softener  once or twice a day to help keep the stool soft.  If you still are having problems with constipation, you may take Miralax once daily as needed to help keep your bowels regular.   Home Blood Pressure Monitoring for Patients   Your provider has recommended that you check your blood pressure (BP) at least once a week at home. If you do not have a blood pressure cuff at home, one will be provided for you. Contact your provider if you have not received your monitor within 1 week.   Helpful Tips for Accurate Home Blood Pressure Checks  Don't smoke, exercise, or drink caffeine 30 minutes before checking your BP Use the restroom before checking your BP (a full bladder can raise your pressure) Relax in a comfortable upright chair Feet on the ground Left arm resting comfortably on a flat surface at the level of your heart Legs uncrossed Back supported Sit quietly and don't talk Place the cuff on your bare arm Adjust snuggly, so that only two fingertips can fit between your skin and the top of the cuff Check 2 readings separated by at least one minute Keep a log of your BP readings For a visual, please reference this diagram: http://ccnc.care/bpdiagram  Provider Name: [facility]     Phone: [phone]  Zone 1: ALL CLEAR  Continue to monitor your symptoms:  BP reading is less than 140 (top number) or less than 90 (bottom   number)  No right upper stomach pain No headaches or seeing spots No feeling nauseated or throwing up No swelling in face and hands  Zone 2: CAUTION Call your doctor's office for any of the following:  BP reading is greater than 140 (top number) or greater than 90 (bottom number)  Stomach pain under your ribs in the middle or right side Headaches or seeing spots Feeling nauseated or throwing up Swelling in face and hands  Zone 3: EMERGENCY  Seek immediate medical care if you have any of the following:  BP reading is greater than160 (top number) or greater than  110 (bottom number) Severe headaches not improving with Tylenol Serious difficulty catching your breath Any worsening symptoms from Zone 2    First Trimester of Pregnancy The first trimester of pregnancy is from week 1 until the end of week 12 (months 1 through 3). A week after a sperm fertilizes an egg, the egg will implant on the wall of the uterus. This embryo will begin to develop into a baby. Genes from you and your partner are forming the baby. The female genes determine whether the baby is a boy or a girl. At 6-8 weeks, the eyes and face are formed, and the heartbeat can be seen on ultrasound. At the end of 12 weeks, all the baby's organs are formed.  Now that you are pregnant, you will want to do everything you can to have a healthy baby. Two of the most important things are to get good prenatal care and to follow your health care provider's instructions. Prenatal care is all the medical care you receive before the baby's birth. This care will help prevent, find, and treat any problems during the pregnancy and childbirth. BODY CHANGES Your body goes through many changes during pregnancy. The changes vary from woman to woman.  You may gain or lose a couple of pounds at first. You may feel sick to your stomach (nauseous) and throw up (vomit). If the vomiting is uncontrollable, call your health care provider. You may tire easily. You may develop headaches that can be relieved by medicines approved by your health care provider. You may urinate more often. Painful urination may mean you have a bladder infection. You may develop heartburn as a result of your pregnancy. You may develop constipation because certain hormones are causing the muscles that push waste through your intestines to slow down. You may develop hemorrhoids or swollen, bulging veins (varicose veins). Your breasts may begin to grow larger and become tender. Your nipples may stick out more, and the tissue that surrounds them  (areola) may become darker. Your gums may bleed and may be sensitive to brushing and flossing. Dark spots or blotches (chloasma, mask of pregnancy) may develop on your face. This will likely fade after the baby is born. Your menstrual periods will stop. You may have a loss of appetite. You may develop cravings for certain kinds of food. You may have changes in your emotions from day to day, such as being excited to be pregnant or being concerned that something may go wrong with the pregnancy and baby. You may have more vivid and strange dreams. You may have changes in your hair. These can include thickening of your hair, rapid growth, and changes in texture. Some women also have hair loss during or after pregnancy, or hair that feels dry or thin. Your hair will most likely return to normal after your baby is born. WHAT TO EXPECT AT YOUR PRENATAL   VISITS During a routine prenatal visit: You will be weighed to make sure you and the baby are growing normally. Your blood pressure will be taken. Your abdomen will be measured to track your baby's growth. The fetal heartbeat will be listened to starting around week 10 or 12 of your pregnancy. Test results from any previous visits will be discussed. Your health care provider may ask you: How you are feeling. If you are feeling the baby move. If you have had any abnormal symptoms, such as leaking fluid, bleeding, severe headaches, or abdominal cramping. If you have any questions. Other tests that may be performed during your first trimester include: Blood tests to find your blood type and to check for the presence of any previous infections. They will also be used to check for low iron levels (anemia) and Rh antibodies. Later in the pregnancy, blood tests for diabetes will be done along with other tests if problems develop. Urine tests to check for infections, diabetes, or protein in the urine. An ultrasound to confirm the proper growth and development  of the baby. An amniocentesis to check for possible genetic problems. Fetal screens for spina bifida and Down syndrome. You may need other tests to make sure you and the baby are doing well. HOME CARE INSTRUCTIONS  Medicines Follow your health care provider's instructions regarding medicine use. Specific medicines may be either safe or unsafe to take during pregnancy. Take your prenatal vitamins as directed. If you develop constipation, try taking a stool softener if your health care provider approves. Diet Eat regular, well-balanced meals. Choose a variety of foods, such as meat or vegetable-based protein, fish, milk and low-fat dairy products, vegetables, fruits, and whole grain breads and cereals. Your health care provider will help you determine the amount of weight gain that is right for you. Avoid raw meat and uncooked cheese. These carry germs that can cause birth defects in the baby. Eating four or five small meals rather than three large meals a day may help relieve nausea and vomiting. If you start to feel nauseous, eating a few soda crackers can be helpful. Drinking liquids between meals instead of during meals also seems to help nausea and vomiting. If you develop constipation, eat more high-fiber foods, such as fresh vegetables or fruit and whole grains. Drink enough fluids to keep your urine clear or pale yellow. Activity and Exercise Exercise only as directed by your health care provider. Exercising will help you: Control your weight. Stay in shape. Be prepared for labor and delivery. Experiencing pain or cramping in the lower abdomen or low back is a good sign that you should stop exercising. Check with your health care provider before continuing normal exercises. Try to avoid standing for long periods of time. Move your legs often if you must stand in one place for a long time. Avoid heavy lifting. Wear low-heeled shoes, and practice good posture. You may continue to have sex  unless your health care provider directs you otherwise. Relief of Pain or Discomfort Wear a good support bra for breast tenderness.   Take warm sitz baths to soothe any pain or discomfort caused by hemorrhoids. Use hemorrhoid cream if your health care provider approves.   Rest with your legs elevated if you have leg cramps or low back pain. If you develop varicose veins in your legs, wear support hose. Elevate your feet for 15 minutes, 3-4 times a day. Limit salt in your diet. Prenatal Care Schedule your prenatal visits by the   twelfth week of pregnancy. They are usually scheduled monthly at first, then more often in the last 2 months before delivery. Write down your questions. Take them to your prenatal visits. Keep all your prenatal visits as directed by your health care provider. Safety Wear your seat belt at all times when driving. Make a list of emergency phone numbers, including numbers for family, friends, the hospital, and police and fire departments. General Tips Ask your health care provider for a referral to a local prenatal education class. Begin classes no later than at the beginning of month 6 of your pregnancy. Ask for help if you have counseling or nutritional needs during pregnancy. Your health care provider can offer advice or refer you to specialists for help with various needs. Do not use hot tubs, steam rooms, or saunas. Do not douche or use tampons or scented sanitary pads. Do not cross your legs for long periods of time. Avoid cat litter boxes and soil used by cats. These carry germs that can cause birth defects in the baby and possibly loss of the fetus by miscarriage or stillbirth. Avoid all smoking, herbs, alcohol, and medicines not prescribed by your health care provider. Chemicals in these affect the formation and growth of the baby. Schedule a dentist appointment. At home, brush your teeth with a soft toothbrush and be gentle when you floss. SEEK MEDICAL CARE IF:   You have dizziness. You have mild pelvic cramps, pelvic pressure, or nagging pain in the abdominal area. You have persistent nausea, vomiting, or diarrhea. You have a bad smelling vaginal discharge. You have pain with urination. You notice increased swelling in your face, hands, legs, or ankles. SEEK IMMEDIATE MEDICAL CARE IF:  You have a fever. You are leaking fluid from your vagina. You have spotting or bleeding from your vagina. You have severe abdominal cramping or pain. You have rapid weight gain or loss. You vomit blood or material that looks like coffee grounds. You are exposed to German measles and have never had them. You are exposed to fifth disease or chickenpox. You develop a severe headache. You have shortness of breath. You have any kind of trauma, such as from a fall or a car accident. Document Released: 11/23/2001 Document Revised: 04/15/2014 Document Reviewed: 10/09/2013 ExitCare Patient Information 2015 [employer]. This information is not intended to replace advice given to you by your health care provider. Make sure you discuss any questions you have with your health care provider.  

## 2022-01-26 LAB — CBC/D/PLT+RPR+RH+ABO+RUBIGG...
Antibody Screen: NEGATIVE
Basophils Absolute: 0 10*3/uL (ref 0.0–0.2)
Basos: 0 %
EOS (ABSOLUTE): 0.1 10*3/uL (ref 0.0–0.4)
Eos: 1 %
HCV Ab: <0.1 s/co ratio (ref 0.0–0.9)
HIV Screen 4th Generation wRfx: NONREACTIVE
Hematocrit: 36.7 % (ref 34.0–46.6)
Hemoglobin: 12.8 g/dL (ref 11.1–15.9)
Hepatitis B Surface Ag: NEGATIVE
Immature Grans (Abs): 0.1 10*3/uL (ref 0.0–0.1)
Immature Granulocytes: 1 %
Lymphocytes Absolute: 2 10*3/uL (ref 0.7–3.1)
Lymphs: 22 %
MCH: 32.7 pg (ref 26.6–33.0)
MCHC: 34.9 g/dL (ref 31.5–35.7)
MCV: 94 fL (ref 79–97)
Monocytes Absolute: 0.7 10*3/uL (ref 0.1–0.9)
Monocytes: 7 %
Neutrophils Absolute: 6.3 10*3/uL (ref 1.4–7.0)
Neutrophils: 69 %
Platelets: 329 10*3/uL (ref 150–450)
RBC: 3.91 x10E6/uL (ref 3.77–5.28)
RDW: 12.8 % (ref 11.7–15.4)
RPR Ser Ql: NONREACTIVE
Rh Factor: POSITIVE
Rubella Antibodies, IGG: 4.01 index (ref >0.99)
WBC: 9.1 10*3/uL (ref 3.4–10.8)

## 2022-01-26 LAB — AFP, SERUM, OPEN SPINA BIFIDA
AFP MoM: 0.82
AFP Value: 36.2 ng/mL
Gest. Age on Collection Date: 19 weeks
Maternal Age At EDD: 36.1 yr
OSBR Risk 1 IN: 10000
Test Results:: NEGATIVE
Weight: 194 [lb_av]

## 2022-01-26 LAB — COMPREHENSIVE METABOLIC PANEL
ALT: 81 IU/L — ABNORMAL HIGH (ref 0–32)
AST: 57 IU/L — ABNORMAL HIGH (ref 0–40)
Albumin/Globulin Ratio: 1.5 (ref 1.2–2.2)
Albumin: 4.2 g/dL (ref 3.8–4.8)
Alkaline Phosphatase: 147 IU/L — ABNORMAL HIGH (ref 44–121)
BUN/Creatinine Ratio: 9 (ref 9–23)
BUN: 5 mg/dL — ABNORMAL LOW (ref 6–20)
Bilirubin Total: 0.2 mg/dL (ref 0.0–1.2)
CO2: 22 mmol/L (ref 20–29)
Calcium: 9.7 mg/dL (ref 8.7–10.2)
Chloride: 102 mmol/L (ref 96–106)
Creatinine, Ser: 0.53 mg/dL — ABNORMAL LOW (ref 0.57–1.00)
Globulin, Total: 2.8 g/dL (ref 1.5–4.5)
Glucose: 76 mg/dL (ref 70–99)
Potassium: 4.2 mmol/L (ref 3.5–5.2)
Sodium: 139 mmol/L (ref 134–144)
Total Protein: 7 g/dL (ref 6.0–8.5)
eGFR: 124 mL/min/{1.73_m2} (ref >59)

## 2022-01-26 LAB — HCV INTERPRETATION

## 2022-01-26 LAB — PROTEIN / CREATININE RATIO, URINE
Creatinine, Urine: 50.9 mg/dL
Protein, Ur: 5.7 mg/dL
Protein/Creat Ratio: 112 mg/g creat (ref 0–200)

## 2022-01-28 LAB — SPECIMEN STATUS REPORT

## 2022-01-28 LAB — GC/CHLAMYDIA PROBE AMP
Chlamydia trachomatis, NAA: NEGATIVE
Neisseria Gonorrhoeae by PCR: NEGATIVE

## 2022-01-28 LAB — HGB A1C W/O EAG: Hgb A1c MFr Bld: 5 % (ref 4.8–5.6)

## 2022-02-01 ENCOUNTER — Encounter: Payer: Self-pay | Admitting: Advanced Practice Midwife

## 2022-02-02 ENCOUNTER — Other Ambulatory Visit: Payer: Self-pay | Admitting: Advanced Practice Midwife

## 2022-02-02 ENCOUNTER — Encounter: Payer: Self-pay | Admitting: Advanced Practice Midwife

## 2022-02-02 DIAGNOSIS — Z363 Encounter for antenatal screening for malformations: Secondary | ICD-10-CM

## 2022-02-03 ENCOUNTER — Other Ambulatory Visit: Payer: BLUE CROSS/BLUE SHIELD

## 2022-02-19 ENCOUNTER — Ambulatory Visit (INDEPENDENT_AMBULATORY_CARE_PROVIDER_SITE_OTHER): Payer: 59 | Admitting: Obstetrics & Gynecology

## 2022-02-19 ENCOUNTER — Encounter: Payer: Self-pay | Admitting: Obstetrics & Gynecology

## 2022-02-19 ENCOUNTER — Ambulatory Visit (INDEPENDENT_AMBULATORY_CARE_PROVIDER_SITE_OTHER): Payer: 59

## 2022-02-19 ENCOUNTER — Encounter: Payer: 59 | Admitting: Advanced Practice Midwife

## 2022-02-19 ENCOUNTER — Other Ambulatory Visit: Payer: Self-pay

## 2022-02-19 VITALS — BP 118/60 | HR 68 | Wt 196.0 lb

## 2022-02-19 DIAGNOSIS — Z363 Encounter for antenatal screening for malformations: Secondary | ICD-10-CM

## 2022-02-19 DIAGNOSIS — O09523 Supervision of elderly multigravida, third trimester: Secondary | ICD-10-CM

## 2022-02-19 DIAGNOSIS — O0992 Supervision of high risk pregnancy, unspecified, second trimester: Secondary | ICD-10-CM

## 2022-02-19 DIAGNOSIS — I10 Essential (primary) hypertension: Secondary | ICD-10-CM

## 2022-02-19 DIAGNOSIS — Z3A23 23 weeks gestation of pregnancy: Secondary | ICD-10-CM | POA: Diagnosis not present

## 2022-02-19 LAB — POCT URINALYSIS DIPSTICK OB
Blood, UA: NEGATIVE
Glucose, UA: NEGATIVE
Ketones, UA: NEGATIVE
Nitrite, UA: NEGATIVE
POC,PROTEIN,UA: NEGATIVE

## 2022-02-19 NOTE — Progress Notes (Signed)
[place] 23 wks,cephalic,posterior placenta gr 0,normal ovaries,cx 4.8 cm,SVP of fluid 5.5 cm,fhr 142 bpm,EFW 626 g 78%,anatomy complete,no obvious abnormalities  ?

## 2022-02-19 NOTE — Progress Notes (Signed)
? ?HIGH-RISK PREGNANCY VISIT ?Patient name: Kayla Fernandez MRN [id number]  Date of birth: [birth year] ?Chief Complaint:   ?High Risk Gestation ([place] today) ? ?History of Present Illness:   ?Kayla Fernandez is a 36 y.o. G48P2002 female at [redacted]w[redacted]d with an Estimated Date of Delivery: 06/18/22 being seen today for ongoing management of a high-risk pregnancy complicated by chronic hypertension currently on labetalol 200 BID.   ? ?Today she reports no complaints. Contractions: Not present. Vag. Bleeding: None.  Movement: Present. denies leaking of fluid.  ? ?Depression screen [facility] 2/9 01/22/2022 07/19/2019 04/12/2018 03/24/2017  ?Decreased Interest 0 0 0 0  ?Down, Depressed, Hopeless 0 0 0 0  ?PHQ - 2 Score 0 0 0 0  ?Altered sleeping 0 - - -  ?Tired, decreased energy 0 - - -  ?Change in appetite 0 - - -  ?Feeling bad or failure about yourself  0 - - -  ?Trouble concentrating 0 - - -  ?Moving slowly or fidgety/restless 0 - - -  ?Suicidal thoughts 0 - - -  ?PHQ-9 Score 0 - - -  ? ?  ?GAD 7 : Generalized Anxiety Score 01/22/2022  ?Nervous, Anxious, on Edge 0  ?Control/stop worrying 0  ?Worry too much - different things 0  ?Trouble relaxing 0  ?Restless 0  ?Easily annoyed or irritable 0  ?Afraid - awful might happen 0  ?Total GAD 7 Score 0  ? ? ? ?Review of Systems:   ?Pertinent items are noted in HPI ?Denies abnormal vaginal discharge w/ itching/odor/irritation, headaches, visual changes, shortness of breath, chest pain, abdominal pain, severe nausea/vomiting, or problems with urination or bowel movements unless otherwise stated above. ?Pertinent History Reviewed:  ?Reviewed past medical,surgical, social, obstetrical and family history.  ?Reviewed problem list, medications and allergies. ?Physical Assessment:  ? ?Vitals:  ? 02/19/22 1224  ?BP: 118/60  ?Pulse: 68  ?Weight: 196 lb (88.9 kg)  ?Body mass index is 32.62 kg/m?. ?     ?     Physical Examination:  ? General appearance: alert, well appearing, and in no distress ? Mental status: alert,  oriented to person, place, and time ? Skin: warm & dry  ? Extremities: Edema: None  ?  Cardiovascular: normal heart rate noted ? Respiratory: normal respiratory effort, no distress ? Abdomen: gravid, soft, non-tender ? Pelvic: Cervical exam deferred        ? ?Fetal Status:     Movement: Present   ? ?Fetal Surveillance Testing today: sonogram  ? ?Chaperone: N/A   ? ?Results for orders placed or performed in visit on 02/19/22 (from the past 24 hour(s))  ?POC Urinalysis Dipstick OB  ? Collection Time: 02/19/22 12:25 PM  ?Result Value Ref Range  ? Color, UA    ? Clarity, UA    ? Glucose, UA Negative Negative  ? Bilirubin, UA    ? Ketones, UA neg   ? Spec Grav, UA    ? Blood, UA neg   ? pH, UA    ? POC,PROTEIN,UA Negative Negative, Trace, Small (1+), Moderate (2+), Large (3+), 4+  ? Urobilinogen, UA    ? Nitrite, UA neg   ? Leukocytes, UA Trace (A) Negative  ? Appearance    ? Odor    ?  ?Assessment & Plan:  ?High-risk pregnancy: G3P2002 at [redacted]w[redacted]d with an Estimated Date of Delivery: 06/18/22  ? ?1) CHTN, stable, on labetalol 200 BID ? ? ? ?Meds: No orders of the defined types were placed in this encounter. ? ? ?  Labs/procedures today: U/S ? ?Treatment Plan:  per protocol  ? ?Reviewed: Preterm labor symptoms and general obstetric precautions including but not limited to vaginal bleeding, contractions, leaking of fluid and fetal movement were reviewed in detail with the patient.  All questions were answered. Does have home bp cuff. Office bp cuff given: not applicable. Check bp weekly, let us know if consistently >150 and/or >95. ? ?Follow-up: Return in about 4 weeks (around 03/19/2022) for PN2, HROB. ? ? ?Future Appointments  ?Date Time Provider [place]  ?02/19/2022 12:50 PM Tephanie Escorcia, Mertie Clause, MD [facility]  ? ? ?Orders Placed This Encounter  ?Procedures  ? POC Urinalysis Dipstick OB  ? ?[place]  ?02/19/2022 ?12:40 PM ? ?

## 2022-03-18 ENCOUNTER — Other Ambulatory Visit: Payer: 59

## 2022-03-18 ENCOUNTER — Ambulatory Visit (INDEPENDENT_AMBULATORY_CARE_PROVIDER_SITE_OTHER): Payer: 59 | Admitting: Obstetrics & Gynecology

## 2022-03-18 ENCOUNTER — Encounter: Payer: Self-pay | Admitting: Obstetrics & Gynecology

## 2022-03-18 VITALS — BP 115/68 | HR 70 | Wt 194.0 lb

## 2022-03-18 DIAGNOSIS — R748 Abnormal levels of other serum enzymes: Secondary | ICD-10-CM | POA: Diagnosis not present

## 2022-03-18 DIAGNOSIS — Z131 Encounter for screening for diabetes mellitus: Secondary | ICD-10-CM

## 2022-03-18 DIAGNOSIS — O0992 Supervision of high risk pregnancy, unspecified, second trimester: Secondary | ICD-10-CM

## 2022-03-18 DIAGNOSIS — Z3A26 26 weeks gestation of pregnancy: Secondary | ICD-10-CM

## 2022-03-18 DIAGNOSIS — I10 Essential (primary) hypertension: Secondary | ICD-10-CM

## 2022-03-18 LAB — POCT URINALYSIS DIPSTICK OB
Blood, UA: NEGATIVE
Glucose, UA: NEGATIVE
Ketones, UA: NEGATIVE
Leukocytes, UA: NEGATIVE
Nitrite, UA: NEGATIVE
POC,PROTEIN,UA: NEGATIVE

## 2022-03-18 NOTE — Progress Notes (Signed)
? ? ?HIGH-RISK PREGNANCY VISIT ?Patient name: Kayla Fernandez MRN [id number]  Date of birth: Jun 09, 1986 ?Chief Complaint:   ?Routine Prenatal Visit ? ?History of Present Illness:   ?Kayla Fernandez is a 36 y.o. G53P2002 female at [redacted]w[redacted]d with an Estimated Date of Delivery: 06/18/22 being seen today for ongoing management of a high-risk pregnancy complicated by [facility].   ? ?Today she reports no complaints. Contractions: Not present. Vag. Bleeding: None.  Movement: Present. denies leaking of fluid.  ? ? ?  03/18/2022  ? 10:41 AM 01/22/2022  ? 10:35 AM 07/19/2019  ?  9:50 AM 04/12/2018  ?  1:52 PM 03/24/2017  ?  8:48 AM  ?Depression screen PHQ 2/9  ?Decreased Interest 0 0 0 0 0  ?Down, Depressed, Hopeless 0 0 0 0 0  ?PHQ - 2 Score 0 0 0 0 0  ?Altered sleeping 0 0     ?Tired, decreased energy 0 0     ?Change in appetite 0 0     ?Feeling bad or failure about yourself  0 0     ?Trouble concentrating 0 0     ?Moving slowly or fidgety/restless 0 0     ?Suicidal thoughts 0 0     ?PHQ-9 Score 0 0     ? ?  ? ?  03/18/2022  ? 10:41 AM 01/22/2022  ? 10:35 AM  ?GAD 7 : Generalized Anxiety Score  ?Nervous, Anxious, on Edge 0 0  ?Control/stop worrying 0 0  ?Worry too much - different things 0 0  ?Trouble relaxing 0 0  ?Restless 0 0  ?Easily annoyed or irritable 0 0  ?Afraid - awful might happen 0 0  ?Total GAD 7 Score 0 0  ? ? ? ?Review of Systems:   ?Pertinent items are noted in HPI ?Denies abnormal vaginal discharge w/ itching/odor/irritation, headaches, visual changes, shortness of breath, chest pain, abdominal pain, severe nausea/vomiting, or problems with urination or bowel movements unless otherwise stated above. ?Pertinent History Reviewed:  ?Reviewed past medical,surgical, social, obstetrical and family history.  ?Reviewed problem list, medications and allergies. ?Physical Assessment:  ? ?Vitals:  ? 03/18/22 1041  ?BP: 115/68  ?Pulse: 70  ?Weight: 194 lb (88 kg)  ?Body mass index is 32.28 kg/m?. ?     ?     Physical Examination:  ? General  appearance: alert, well appearing, and in no distress ? Mental status: alert, oriented to person, place, and time ? Skin: warm & dry  ? Extremities: Edema: None  ?  Cardiovascular: normal heart rate noted ? Respiratory: normal respiratory effort, no distress ? Abdomen: gravid, soft, non-tender ? Pelvic: Cervical exam deferred        ? ?Fetal Status: Fetal Heart Rate (bpm): 151 Fundal Height: 27 cm Movement: Present   ? ?Fetal Surveillance Testing today: 151  ? ?Chaperone: N/A   ? ?Results for orders placed or performed in visit on 03/18/22 (from the past 24 hour(s))  ?POC Urinalysis Dipstick OB  ? Collection Time: 03/18/22 10:44 AM  ?Result Value Ref Range  ? Color, UA    ? Clarity, UA    ? Glucose, UA Negative Negative  ? Bilirubin, UA    ? Ketones, UA neg   ? Spec Grav, UA    ? Blood, UA neg   ? pH, UA    ? POC,PROTEIN,UA Negative Negative, Trace, Small (1+), Moderate (2+), Large (3+), 4+  ? Urobilinogen, UA    ? Nitrite, UA neg   ? Leukocytes,  UA Negative Negative  ? Appearance    ? Odor    ?  ?Assessment & Plan:  ?High-risk pregnancy: G3P2002 at [redacted]w[redacted]d with an Estimated Date of Delivery: 06/18/22  ? ? ?  ICD-10-CM   ?1. [redacted] weeks gestation of pregnancy  Z3A.26 POC Urinalysis Dipstick OB  ?  ?2. Supervision of high risk pregnancy in second trimester  O09.92 POC Urinalysis Dipstick OB  ? due to [facility]  ?  ?3. Chronic hypertension  I10   ? labetalol 200 BID: good control, continue  ?  ?  ? ? ?Meds: No orders of the defined types were placed in this encounter. ? ? ?Orders:  ?Orders Placed This Encounter  ?Procedures  ? POC Urinalysis Dipstick OB  ?  ? ?Labs/procedures today: none ? ?Treatment Plan:  Sonogram next visit ? ?Reviewed: Preterm labor symptoms and general obstetric precautions including but not limited to vaginal bleeding, contractions, leaking of fluid and fetal movement were reviewed in detail with the patient.  All questions were answered. Doeshave home bp cuff. Office bp cuff given: not applicable. Check bp  daily, let us know if consistently >150 and/or >95. ? ?Follow-up: Return in about 3 weeks (around 04/08/2022) for sonogram for fetal weight, HROB. ? ? ?No future appointments. ? ?Orders Placed This Encounter  ?Procedures  ? POC Urinalysis Dipstick OB  ? ?[place]  ?Attending Physician for the [facility] ?03/18/2022 ?11:04 AM ? ?

## 2022-03-19 LAB — CBC
Hematocrit: 32.7 % — ABNORMAL LOW (ref 34.0–46.6)
Hemoglobin: 11.1 g/dL (ref 11.1–15.9)
MCH: 32.1 pg (ref 26.6–33.0)
MCHC: 33.9 g/dL (ref 31.5–35.7)
MCV: 95 fL (ref 79–97)
Platelets: 268 10*3/uL (ref 150–450)
RBC: 3.46 x10E6/uL — ABNORMAL LOW (ref 3.77–5.28)
RDW: 12.8 % (ref 11.7–15.4)
WBC: 8 10*3/uL (ref 3.4–10.8)

## 2022-03-19 LAB — HEPATIC FUNCTION PANEL
ALT: 23 IU/L (ref 0–32)
AST: 18 IU/L (ref 0–40)
Albumin: 3.4 g/dL — ABNORMAL LOW (ref 3.8–4.8)
Alkaline Phosphatase: 132 IU/L — ABNORMAL HIGH (ref 44–121)
Bilirubin Total: 0.3 mg/dL (ref 0.0–1.2)
Bilirubin, Direct: <0.1 mg/dL (ref 0.00–0.40)
Total Protein: 6.4 g/dL (ref 6.0–8.5)

## 2022-03-19 LAB — GLUCOSE TOLERANCE, 2 HOURS W/ 1HR
Glucose, 1 hour: 124 mg/dL (ref 70–179)
Glucose, 2 hour: 106 mg/dL (ref 70–152)
Glucose, Fasting: 74 mg/dL (ref 70–91)

## 2022-03-19 LAB — RPR: RPR Ser Ql: NONREACTIVE

## 2022-03-19 LAB — ANTIBODY SCREEN: Antibody Screen: NEGATIVE

## 2022-03-19 LAB — HIV ANTIBODY (ROUTINE TESTING W REFLEX): HIV Screen 4th Generation wRfx: NONREACTIVE

## 2022-04-07 ENCOUNTER — Other Ambulatory Visit: Payer: Self-pay | Admitting: Obstetrics & Gynecology

## 2022-04-07 DIAGNOSIS — O10919 Unspecified pre-existing hypertension complicating pregnancy, unspecified trimester: Secondary | ICD-10-CM

## 2022-04-08 ENCOUNTER — Ambulatory Visit (INDEPENDENT_AMBULATORY_CARE_PROVIDER_SITE_OTHER): Payer: 59

## 2022-04-08 ENCOUNTER — Encounter: Payer: Self-pay | Admitting: Obstetrics & Gynecology

## 2022-04-08 ENCOUNTER — Ambulatory Visit (INDEPENDENT_AMBULATORY_CARE_PROVIDER_SITE_OTHER): Payer: 59 | Admitting: Obstetrics & Gynecology

## 2022-04-08 VITALS — BP 113/72 | HR 77 | Wt 195.0 lb

## 2022-04-08 DIAGNOSIS — O10919 Unspecified pre-existing hypertension complicating pregnancy, unspecified trimester: Secondary | ICD-10-CM | POA: Diagnosis not present

## 2022-04-08 DIAGNOSIS — I10 Essential (primary) hypertension: Secondary | ICD-10-CM

## 2022-04-08 DIAGNOSIS — O0992 Supervision of high risk pregnancy, unspecified, second trimester: Secondary | ICD-10-CM

## 2022-04-08 NOTE — Progress Notes (Signed)
? ? ?HIGH-RISK PREGNANCY VISIT ?Patient name: Thomasa Solis MRN [id number]  Date of birth: 06-21-1986 ?Chief Complaint:   ?Routine Prenatal Visit ? ?History of Present Illness:   ?Kyanne Eskin is a 36 y.o. G54P2002 female at [redacted]w[redacted]d with an Estimated Date of Delivery: 06/18/22 being seen today for ongoing management of a high-risk pregnancy complicated by chronic hypertension currently on labetalol 200 BID.   ? ?Today she reports no complaints. Contractions: Not present. Vag. Bleeding: None.  Movement: Present. denies leaking of fluid.  ? ? ?  03/18/2022  ? 10:41 AM 01/22/2022  ? 10:35 AM 07/19/2019  ?  9:50 AM 04/12/2018  ?  1:52 PM 03/24/2017  ?  8:48 AM  ?Depression screen PHQ 2/9  ?Decreased Interest 0 0 0 0 0  ?Down, Depressed, Hopeless 0 0 0 0 0  ?PHQ - 2 Score 0 0 0 0 0  ?Altered sleeping 0 0     ?Tired, decreased energy 0 0     ?Change in appetite 0 0     ?Feeling bad or failure about yourself  0 0     ?Trouble concentrating 0 0     ?Moving slowly or fidgety/restless 0 0     ?Suicidal thoughts 0 0     ?PHQ-9 Score 0 0     ? ?  ? ?  03/18/2022  ? 10:41 AM 01/22/2022  ? 10:35 AM  ?GAD 7 : Generalized Anxiety Score  ?Nervous, Anxious, on Edge 0 0  ?Control/stop worrying 0 0  ?Worry too much - different things 0 0  ?Trouble relaxing 0 0  ?Restless 0 0  ?Easily annoyed or irritable 0 0  ?Afraid - awful might happen 0 0  ?Total GAD 7 Score 0 0  ? ? ? ?Review of Systems:   ?Pertinent items are noted in HPI ?Denies abnormal vaginal discharge w/ itching/odor/irritation, headaches, visual changes, shortness of breath, chest pain, abdominal pain, severe nausea/vomiting, or problems with urination or bowel movements unless otherwise stated above. ?Pertinent History Reviewed:  ?Reviewed past medical,surgical, social, obstetrical and family history.  ?Reviewed problem list, medications and allergies. ?Physical Assessment:  ? ?Vitals:  ? 04/08/22 1217  ?BP: 113/72  ?Pulse: 77  ?Weight: 195 lb (88.5 kg)  ?Body mass index is 32.45  kg/m?. ?     ?     Physical Examination:  ? General appearance: alert, well appearing, and in no distress ? Mental status: alert, oriented to person, place, and time ? Skin: warm & dry  ? Extremities: Edema: None  ?  Cardiovascular: normal heart rate noted ? Respiratory: normal respiratory effort, no distress ? Abdomen: gravid, soft, non-tender ? Pelvic: Cervical exam deferred        ? ?Fetal Status:     Movement: Present   ? ?Fetal Surveillance Testing today: BPP 8/8, EFW 47%  ? ?Chaperone: N/A   ? ?No results found for this or any previous visit (from the past 24 hour(s)).  ?Assessment & Plan:  ?High-risk pregnancy: G3P2002 at [redacted]w[redacted]d with an Estimated Date of Delivery: 06/18/22  ? ? ?  ICD-10-CM   ?1. Supervision of high risk pregnancy in second trimester  O09.92   ?  ?2. Chronic hypertension  I10   ? Good BP on labetalol 200 BID, reassuring fetal surveillance  ?  ?  ? ? ? ?Meds: No orders of the defined types were placed in this encounter. ? ? ?Orders: No orders of the defined types were placed in this encounter. ?  ? ?  Labs/procedures today: U/S ? ?Treatment Plan:  begin twice weekly surveillance at 32 weeks ? ? ?Follow-up: No follow-ups on file. ? ? ?Future Appointments  ?Date Time Provider [place]  ?04/08/2022  1:30 PM Bartolo Montanye, Mertie Clause, MD [facility]  ? ? ?No orders of the defined types were placed in this encounter. ? ?Florian Buff  ?Attending Physician for the Center for [place] ?[facility] ?04/08/2022 ?12:26 PM ? ?

## 2022-04-08 NOTE — Progress Notes (Signed)
[place] 29+6 wks,cephalic,posterior placenta gr 0,FHR 130 bpm,AFI 16 cm,normal ovaries,EFW 1516 g 47% ?

## 2022-04-22 ENCOUNTER — Other Ambulatory Visit: Payer: Medicaid Other

## 2022-04-22 ENCOUNTER — Encounter: Payer: Self-pay | Admitting: Obstetrics & Gynecology

## 2022-04-22 ENCOUNTER — Ambulatory Visit: Payer: Medicaid Other | Admitting: Obstetrics & Gynecology

## 2022-04-22 VITALS — BP 110/70 | HR 75 | Wt 197.4 lb

## 2022-04-22 DIAGNOSIS — O0992 Supervision of high risk pregnancy, unspecified, second trimester: Secondary | ICD-10-CM

## 2022-04-22 NOTE — Progress Notes (Incomplete)
? ? ?HIGH-RISK PREGNANCY VISIT ?Patient name: Kayla Fernandez MRN [id number]  Date of birth: 09/28/1986 ?Chief Complaint:   ?High Risk Gestation and Non-stress Test ? ?History of Present Illness:   ?Kayla Fernandez is a 36 y.o. G22P2002 female at [redacted]w[redacted]d with an Estimated Date of Delivery: 06/18/22 being seen today for ongoing management of a high-risk pregnancy complicated by {High Risk OB:23190}.   ? ?Today she reports {pregnancy symptoms:25616::"no complaints"}. Contractions: Not present.  .  Movement: Present. {Actions; denies-reports:120008} leaking of fluid.  ? ? ?  03/18/2022  ? 10:41 AM 01/22/2022  ? 10:35 AM 07/19/2019  ?  9:50 AM 04/12/2018  ?  1:52 PM 03/24/2017  ?  8:48 AM  ?Depression screen PHQ 2/9  ?Decreased Interest 0 0 0 0 0  ?Down, Depressed, Hopeless 0 0 0 0 0  ?PHQ - 2 Score 0 0 0 0 0  ?Altered sleeping 0 0     ?Tired, decreased energy 0 0     ?Change in appetite 0 0     ?Feeling bad or failure about yourself  0 0     ?Trouble concentrating 0 0     ?Moving slowly or fidgety/restless 0 0     ?Suicidal thoughts 0 0     ?PHQ-9 Score 0 0     ? ?  ? ?  03/18/2022  ? 10:41 AM 01/22/2022  ? 10:35 AM  ?GAD 7 : Generalized Anxiety Score  ?Nervous, Anxious, on Edge 0 0  ?Control/stop worrying 0 0  ?Worry too much - different things 0 0  ?Trouble relaxing 0 0  ?Restless 0 0  ?Easily annoyed or irritable 0 0  ?Afraid - awful might happen 0 0  ?Total GAD 7 Score 0 0  ? ? ? ?Review of Systems:   ?Pertinent items are noted in HPI ?Denies abnormal vaginal discharge w/ itching/odor/irritation, headaches, visual changes, shortness of breath, chest pain, abdominal pain, severe nausea/vomiting, or problems with urination or bowel movements unless otherwise stated above. ?Pertinent History Reviewed:  ?Reviewed past medical,surgical, social, obstetrical and family history.  ?Reviewed problem list, medications and allergies. ?Physical Assessment:  ? ?Vitals:  ? 04/22/22 1058  ?BP: 110/70  ?Pulse: 75  ?Weight: 197 lb 6.4 oz (89.5 kg)   ?Body mass index is 32.85 kg/m?. ?     ?     Physical Examination:  ? General appearance: {:315021} ? Mental status: {:313008} ? Skin: warm & dry  ? Extremities: Edema: Trace  ?  Cardiovascular: normal heart rate noted ? Respiratory: normal respiratory effort, no distress ? Abdomen: gravid, soft, non-tender ? Pelvic: {Blank single:19197::"Cervical exam performed","Cervical exam deferred"}        ? ?Fetal Status:     Movement: Present   ? ?Fetal Surveillance Testing today: ***  ? ?Chaperone: {Chaperone:19197::"N/A","pt declined","Latisha Cresenzo","Janet Young","Amanda Andrews","Peggy Dones","Angel Neas"}   ? ?No results found for this or any previous visit (from the past 24 hour(s)).  ?Assessment & Plan:  ?High-risk pregnancy: G3P2002 at [redacted]w[redacted]d with an Estimated Date of Delivery: 06/18/22  ? ? ?  ICD-10-CM   ?1. Supervision of high risk pregnancy in second trimester  O09.92   ?  ?  ? ?1) ***, {stable/unstable:60080} ? ?2) ***, {stable/unstable:60080} ? ?Meds: No orders of the defined types were placed in this encounter. ? ? ?Orders: No orders of the defined types were placed in this encounter. ?  ? ?Labs/procedures today: {ob lab/procedures:25214} ? ?Treatment Plan:  *** ? ?Reviewed: {Blank single:19197::"Term","Preterm"} labor symptoms  and general obstetric precautions including but not limited to vaginal bleeding, contractions, leaking of fluid and fetal movement were reviewed in detail with the patient.  All questions were answered. {does does not:25387::"Does"} have home bp cuff. Office bp cuff given: {yes/no/default n/a:21102::"not applicable"}. Check bp {weekly daily:25388::"weekly"}, let us know if consistently {pregnant bp:25389::">140 and/or >90"}. ? ?Follow-up: No follow-ups on file. ? ? ?No future appointments. ? ?No orders of the defined types were placed in this encounter. ? ?Lazaro Arms  ?Attending Physician for the Center for [facility] ?[facility] ?04/22/2022 ?11:49 AM ? ?

## 2022-04-26 ENCOUNTER — Ambulatory Visit (INDEPENDENT_AMBULATORY_CARE_PROVIDER_SITE_OTHER): Payer: Medicaid Other | Admitting: *Deleted

## 2022-04-26 DIAGNOSIS — I10 Essential (primary) hypertension: Secondary | ICD-10-CM

## 2022-04-26 DIAGNOSIS — Z3A32 32 weeks gestation of pregnancy: Secondary | ICD-10-CM

## 2022-04-26 DIAGNOSIS — O0993 Supervision of high risk pregnancy, unspecified, third trimester: Secondary | ICD-10-CM

## 2022-04-26 LAB — POCT URINALYSIS DIPSTICK OB
Blood, UA: NEGATIVE
Glucose, UA: NEGATIVE
Ketones, UA: NEGATIVE
Leukocytes, UA: NEGATIVE
Nitrite, UA: NEGATIVE
POC,PROTEIN,UA: NEGATIVE

## 2022-04-26 NOTE — Progress Notes (Signed)
? ?  NURSE VISIT- NST ? ?SUBJECTIVE:  ?Kayla Fernandez is a 36 y.o. G42P2002 female at [redacted]w[redacted]d, here for a NST for pregnancy complicated by [facility].  She reports active fetal movement, contractions: none, vaginal bleeding: none, membranes: intact.  ? ?OBJECTIVE:  ?BP 113/70   Pulse 64   Wt 196 lb 6.4 oz (89.1 kg)   LMP 08/29/2021   BMI 32.68 kg/m?   ?Appears well, no apparent distress ? ?No results found for this or any previous visit (from the past 24 hour(s)). ? ?NST: FHR baseline 135 bpm, Variability: moderate, Accelerations:present, Decelerations:  Absent= Cat 1/reactive ?Toco: none  ? ?ASSESSMENT: ?G3P2002 at [redacted]w[redacted]d with CHTN ?NST reactive ? ?PLAN: ?EFM strip reviewed by Dr. Charlotta Newton   ?Recommendations: keep next appointment as scheduled   ? ?Debbe Odea Ajanay Farve  ?04/26/2022 ?10:56 AM ? ?

## 2022-04-29 ENCOUNTER — Ambulatory Visit (INDEPENDENT_AMBULATORY_CARE_PROVIDER_SITE_OTHER): Payer: Medicaid Other | Admitting: Advanced Practice Midwife

## 2022-04-29 ENCOUNTER — Encounter: Payer: Self-pay | Admitting: Advanced Practice Midwife

## 2022-04-29 ENCOUNTER — Other Ambulatory Visit: Payer: Medicaid Other

## 2022-04-29 VITALS — BP 121/57 | HR 68 | Wt 195.8 lb

## 2022-04-29 DIAGNOSIS — Z1389 Encounter for screening for other disorder: Secondary | ICD-10-CM

## 2022-04-29 DIAGNOSIS — Z331 Pregnant state, incidental: Secondary | ICD-10-CM

## 2022-04-29 DIAGNOSIS — O288 Other abnormal findings on antenatal screening of mother: Secondary | ICD-10-CM

## 2022-04-29 DIAGNOSIS — O0993 Supervision of high risk pregnancy, unspecified, third trimester: Secondary | ICD-10-CM

## 2022-04-29 LAB — POCT URINALYSIS DIPSTICK OB
Blood, UA: NEGATIVE
Glucose, UA: NEGATIVE
Ketones, UA: NEGATIVE
Leukocytes, UA: NEGATIVE
Nitrite, UA: NEGATIVE

## 2022-04-29 NOTE — Progress Notes (Signed)
HIGH-RISK PREGNANCY VISIT Patient name: Kayla Fernandez MRN [id number]  Date of birth: Jun 20, 1986 Chief Complaint:   Routine Prenatal Visit and Non-stress Test  History of Present Illness:   Kayla Fernandez is a 36 y.o. G1P2002 female at [redacted]w[redacted]d with an Estimated Date of Delivery: 06/18/22 being seen today for ongoing management of a high-risk pregnancy complicated by [facility] currently on labetalol 200mg  BID Today she reports no complaints. Contractions: Not present.  .  Movement: Present. denies leaking of fluid.  Review of Systems:   Pertinent items are noted in HPI Denies abnormal vaginal discharge w/ itching/odor/irritation, headaches, visual changes, shortness of breath, chest pain, abdominal pain, severe nausea/vomiting, or problems with urination or bowel movements unless otherwise stated above. Pertinent History Reviewed:  Reviewed past medical,surgical, social, obstetrical and family history.  Reviewed problem list, medications and allergies. Physical Assessment:   Vitals:   04/29/22 1113  BP: (!) 121/57  Pulse: 68  Weight: 195 lb 12.8 oz (88.8 kg)  Body mass index is 32.58 kg/m.           Physical Examination:   General appearance: alert, well appearing, and in no distress  Mental status: alert, oriented to person, place, and time  Skin: warm & dry   Extremities: Edema: None    Cardiovascular: normal heart rate noted  Respiratory: normal respiratory effort, no distress  Abdomen: gravid, soft, non-tender  Pelvic: Cervical exam deferred         Fetal Status:     Movement: Present    Fetal Surveillance Testing today: NST: FHR baseline 145 bpm, Variability: moderate, Accelerations:present, Decelerations:  Absent= Cat 1/Reactive    Results for orders placed or performed in visit on 04/29/22 (from the past 24 hour(s))  POC Urinalysis Dipstick OB   Collection Time: 04/29/22 11:28 AM  Result Value Ref Range   Color, UA     Clarity, UA     Glucose, UA Negative Negative    Bilirubin, UA     Ketones, UA neg    Spec Grav, UA     Blood, UA neg    pH, UA     POC,PROTEIN,UA Trace Negative, Trace, Small (1+), Moderate (2+), Large (3+), 4+   Urobilinogen, UA     Nitrite, UA neg    Leukocytes, UA Negative Negative   Appearance     Odor      Assessment & Plan:  1) High-risk pregnancy G3P2002 at [redacted]w[redacted]d with an Estimated Date of Delivery: 06/18/22   2) CHTN, stable Treatment plan     Growth u/s q 4wks    2x/wk testing nst/sono @ 32wks     Deliver 38-39wks (37wks or prn if poor control)____   Meds: No orders of the defined types were placed in this encounter.   Labs/procedures today: NST   Reviewed: Preterm labor symptoms and general obstetric precautions including but not limited to vaginal bleeding, contractions, leaking of fluid and fetal movement were reviewed in detail with the patient.  All questions were answered. Has home bp cuff.. Check bp weekly, let us know if >140/90.   Follow-up: Return for As scheduled (may cancel appts after 6/29).  Future Appointments  Date Time Provider [place]  05/03/2022  9:30 AM [facility] NURSE [facility]  05/06/2022  2:50 PM [facility] NURSE [facility]  05/06/2022  3:10 PM Janyth Pupa, DO [facility]  05/11/2022  2:15 PM [facility] [place] [facility] None  05/11/2022  2:50 PM Florian Buff, MD [facility] [place]  05/13/2022  9:50 AM [facility] NURSE [facility]  05/13/2022 10:10 AM Florian Buff, MD [facility]  05/17/2022  9:30 AM [facility] NURSE [facility]  05/20/2022  3:45 PM [place] [facility] [place] [facility] None  05/20/2022  4:30 PM Janyth Pupa, DO [facility]  05/24/2022  9:30 AM [facility] NURSE [facility]  05/27/2022 11:30 AM [facility] [place] [facility] None  05/27/2022 12:15 PM Florian Buff, MD [facility]  05/31/2022  9:50 AM [facility] NURSE [facility]  06/03/2022  9:15 AM [facility] NURSE [facility] [place]  06/03/2022  9:30 AM [facility] US3 [facility]  06/03/2022 11:50 AM Florian Buff,  MD [facility]  06/07/2022  9:30 AM [facility] NURSE [facility]  06/10/2022 10:00 AM [facility] [place] [facility] None  06/10/2022 11:10 AM Janyth Pupa, DO [facility]  06/14/2022  9:50 AM [facility] NURSE [facility]  06/18/2022  9:15 AM [facility] [place] [facility] None  06/18/2022 10:10 AM Florian Buff, MD [facility]    Orders Placed This Encounter  Procedures   POC Urinalysis Dipstick OB   Christin Fudge DNP, CNM 04/29/2022 11:52 AM

## 2022-04-29 NOTE — Patient Instructions (Signed)
Antony Salmon, I greatly value your feedback.  If you receive a survey following your visit with [place] today, we appreciate you taking the time to fill it out.  Thanks, Nigel Berthold, CNM   [place]!!! It is now [facility] ([place], [place] [address]) Entrance located off of [address] parking   Go to [email or web address] to register for FREE online childbirth classes    Call the office [phone]) or go to [facility] if: You begin to have strong, frequent contractions Your water breaks.  Sometimes it is a big gush of fluid, sometimes it is just a trickle that keeps getting your panties wet or running down your legs You have vaginal bleeding.  It is normal to have a small amount of spotting if your cervix was checked.  You don't feel your baby moving like normal.  If you don't, get you something to eat and drink and lay down and focus on feeling your baby move.  You should feel at least 10 movements in 2 hours.  If you don't, you should call the office or go to [facility].    Tdap Vaccine It is recommended that you get the Tdap vaccine during the third trimester of EACH pregnancy to help protect your baby from getting pertussis (whooping cough) 27-36 weeks is the BEST time to do this so that you can pass the protection on to your baby. During pregnancy is better than after pregnancy, but if you are unable to get it during pregnancy it will be offered at the hospital.  You will be offered this vaccine in the office after 27 weeks. If you do not have health insurance, you can get this vaccine at the health department or your family doctor Everyone who will be around your baby should also be up-to-date on their vaccines. Adults (who are not pregnant) only need 1 dose of Tdap during adulthood.   Third Trimester of Pregnancy The third trimester is from week 29 through week 42, months 7 through 9. The third  trimester is a time when the fetus is growing rapidly. At the end of the ninth month, the fetus is about 20 inches in length and weighs 6-10 pounds.  BODY CHANGES Your body goes through many changes during pregnancy. The changes vary from woman to woman.  Your weight will continue to increase. You can expect to gain 25-35 pounds (11-16 kg) by the end of the pregnancy. You may begin to get stretch marks on your hips, abdomen, and breasts. You may urinate more often because the fetus is moving lower into your pelvis and pressing on your bladder. You may develop or continue to have heartburn as a result of your pregnancy. You may develop constipation because certain hormones are causing the muscles that push waste through your intestines to slow down. You may develop hemorrhoids or swollen, bulging veins (varicose veins). You may have pelvic pain because of the weight gain and pregnancy hormones relaxing your joints between the bones in your pelvis. Backaches may result from overexertion of the muscles supporting your posture. You may have changes in your hair. These can include thickening of your hair, rapid growth, and changes in texture. Some women also have hair loss during or after pregnancy, or hair that feels dry or thin. Your hair will most likely return to normal after your baby is born. Your breasts will continue to grow and be tender. A yellow  discharge may leak from your breasts called colostrum. Your belly button may stick out. You may feel short of breath because of your expanding uterus. You may notice the fetus "dropping," or moving lower in your abdomen. You may have a bloody mucus discharge. This usually occurs a few days to a week before labor begins. Your cervix becomes thin and soft (effaced) near your due date. WHAT TO EXPECT AT YOUR PRENATAL EXAMS  You will have prenatal exams every 2 weeks until week 36. Then, you will have weekly prenatal exams. During a routine prenatal  visit: You will be weighed to make sure you and the fetus are growing normally. Your blood pressure is taken. Your abdomen will be measured to track your baby's growth. The fetal heartbeat will be listened to. Any test results from the previous visit will be discussed. You may have a cervical check near your due date to see if you have effaced. At around 36 weeks, your caregiver will check your cervix. At the same time, your caregiver will also perform a test on the secretions of the vaginal tissue. This test is to determine if a type of bacteria, Group B streptococcus, is present. Your caregiver will explain this further. Your caregiver may ask you: What your birth plan is. How you are feeling. If you are feeling the baby move. If you have had any abnormal symptoms, such as leaking fluid, bleeding, severe headaches, or abdominal cramping. If you have any questions. Other tests or screenings that may be performed during your third trimester include: Blood tests that check for low iron levels (anemia). Fetal testing to check the health, activity level, and growth of the fetus. Testing is done if you have certain medical conditions or if there are problems during the pregnancy. FALSE LABOR You may feel small, irregular contractions that eventually go away. These are called Braxton Hicks contractions, or false labor. Contractions may last for hours, days, or even weeks before true labor sets in. If contractions come at regular intervals, intensify, or become painful, it is best to be seen by your caregiver.  SIGNS OF LABOR  Menstrual-like cramps. Contractions that are 5 minutes apart or less. Contractions that start on the top of the uterus and spread down to the lower abdomen and back. A sense of increased pelvic pressure or back pain. A watery or bloody mucus discharge that comes from the vagina. If you have any of these signs before the 37th week of pregnancy, call your caregiver right away.  You need to go to the hospital to get checked immediately. HOME CARE INSTRUCTIONS  Avoid all smoking, herbs, alcohol, and unprescribed drugs. These chemicals affect the formation and growth of the baby. Follow your caregiver's instructions regarding medicine use. There are medicines that are either safe or unsafe to take during pregnancy. Exercise only as directed by your caregiver. Experiencing uterine cramps is a good sign to stop exercising. Continue to eat regular, healthy meals. Wear a good support bra for breast tenderness. Do not use hot tubs, steam rooms, or saunas. Wear your seat belt at all times when driving. Avoid raw meat, uncooked cheese, cat litter boxes, and soil used by cats. These carry germs that can cause birth defects in the baby. Take your prenatal vitamins. Try taking a stool softener (if your caregiver approves) if you develop constipation. Eat more high-fiber foods, such as fresh vegetables or fruit and whole grains. Drink plenty of fluids to keep your urine clear or pale yellow. Take  warm sitz baths to soothe any pain or discomfort caused by hemorrhoids. Use hemorrhoid cream if your caregiver approves. If you develop varicose veins, wear support hose. Elevate your feet for 15 minutes, 3-4 times a day. Limit salt in your diet. Avoid heavy lifting, wear low heal shoes, and practice good posture. Rest a lot with your legs elevated if you have leg cramps or low back pain. Visit your dentist if you have not gone during your pregnancy. Use a soft toothbrush to brush your teeth and be gentle when you floss. A sexual relationship may be continued unless your caregiver directs you otherwise. Do not travel far distances unless it is absolutely necessary and only with the approval of your caregiver. Take prenatal classes to understand, practice, and ask questions about the labor and delivery. Make a trial run to the hospital. Pack your hospital bag. Prepare the baby's  nursery. Continue to go to all your prenatal visits as directed by your caregiver. SEEK MEDICAL CARE IF: You are unsure if you are in labor or if your water has broken. You have dizziness. You have mild pelvic cramps, pelvic pressure, or nagging pain in your abdominal area. You have persistent nausea, vomiting, or diarrhea. You have a bad smelling vaginal discharge. You have pain with urination. SEEK IMMEDIATE MEDICAL CARE IF:  You have a fever. You are leaking fluid from your vagina. You have spotting or bleeding from your vagina. You have severe abdominal cramping or pain. You have rapid weight loss or gain. You have shortness of breath with chest pain. You notice sudden or extreme swelling of your face, hands, ankles, feet, or legs. You have not felt your baby move in over an hour. You have severe headaches that do not go away with medicine. You have vision changes. Document Released: 11/23/2001 Document Revised: 12/04/2013 Document Reviewed: 01/30/2013 [facility] Patient Information 2015 [place], [place]. This information is not intended to replace advice given to you by your health care provider. Make sure you discuss any questions you have with your health care provider.

## 2022-05-03 ENCOUNTER — Ambulatory Visit (INDEPENDENT_AMBULATORY_CARE_PROVIDER_SITE_OTHER): Payer: Medicaid Other | Admitting: *Deleted

## 2022-05-03 VITALS — BP 95/56 | HR 66 | Wt 193.0 lb

## 2022-05-03 DIAGNOSIS — O0993 Supervision of high risk pregnancy, unspecified, third trimester: Secondary | ICD-10-CM

## 2022-05-03 DIAGNOSIS — I10 Essential (primary) hypertension: Secondary | ICD-10-CM

## 2022-05-03 DIAGNOSIS — Z331 Pregnant state, incidental: Secondary | ICD-10-CM

## 2022-05-03 DIAGNOSIS — Z1389 Encounter for screening for other disorder: Secondary | ICD-10-CM | POA: Diagnosis not present

## 2022-05-03 DIAGNOSIS — Z3A33 33 weeks gestation of pregnancy: Secondary | ICD-10-CM | POA: Diagnosis not present

## 2022-05-03 DIAGNOSIS — O288 Other abnormal findings on antenatal screening of mother: Secondary | ICD-10-CM

## 2022-05-03 LAB — POCT URINALYSIS DIPSTICK OB
Blood, UA: NEGATIVE
Glucose, UA: NEGATIVE
Ketones, UA: NEGATIVE
Leukocytes, UA: NEGATIVE
Nitrite, UA: NEGATIVE

## 2022-05-03 NOTE — Progress Notes (Signed)
   NURSE VISIT- NST  SUBJECTIVE:  Kayla Fernandez is a 36 y.o. G37P2002 female at [redacted]w[redacted]d, here for a NST for pregnancy complicated by [facility].  She reports active fetal movement, contractions: none, vaginal bleeding: none, membranes: intact.   OBJECTIVE:  BP (!) 95/56   Pulse 66   Wt 193 lb (87.5 kg)   LMP 08/29/2021   BMI 32.12 kg/m   Appears well, no apparent distress  Results for orders placed or performed in visit on 05/03/22 (from the past 24 hour(s))  POC Urinalysis Dipstick OB   Collection Time: 05/03/22  9:55 AM  Result Value Ref Range   Color, UA     Clarity, UA     Glucose, UA Negative Negative   Bilirubin, UA     Ketones, UA neg    Spec Grav, UA     Blood, UA neg    pH, UA     POC,PROTEIN,UA Trace Negative, Trace, Small (1+), Moderate (2+), Large (3+), 4+   Urobilinogen, UA     Nitrite, UA neg    Leukocytes, UA Negative Negative   Appearance     Odor      NST: FHR baseline 130 bpm, Variability: moderate, Accelerations:present, Decelerations:  Absent= Cat 1/reactive Toco: UI  ASSESSMENT: [id number] at [redacted]w[redacted]d with CHTN NST reactive  PLAN: EFM strip reviewed by Dr. Charlotta Newton   Recommendations: keep next appointment as scheduled    Jobe Marker  05/03/2022 12:10 PM

## 2022-05-05 ENCOUNTER — Other Ambulatory Visit: Payer: Self-pay | Admitting: Advanced Practice Midwife

## 2022-05-05 DIAGNOSIS — O10919 Unspecified pre-existing hypertension complicating pregnancy, unspecified trimester: Secondary | ICD-10-CM

## 2022-05-05 DIAGNOSIS — O09523 Supervision of elderly multigravida, third trimester: Secondary | ICD-10-CM

## 2022-05-06 ENCOUNTER — Other Ambulatory Visit: Payer: Medicaid Other

## 2022-05-06 ENCOUNTER — Encounter: Payer: Self-pay | Admitting: Obstetrics & Gynecology

## 2022-05-06 ENCOUNTER — Ambulatory Visit (INDEPENDENT_AMBULATORY_CARE_PROVIDER_SITE_OTHER): Payer: Medicaid Other

## 2022-05-06 ENCOUNTER — Other Ambulatory Visit: Payer: Self-pay | Admitting: Obstetrics & Gynecology

## 2022-05-06 ENCOUNTER — Ambulatory Visit (INDEPENDENT_AMBULATORY_CARE_PROVIDER_SITE_OTHER): Payer: Medicaid Other | Admitting: Obstetrics & Gynecology

## 2022-05-06 VITALS — BP 118/73 | HR 79 | Wt 191.8 lb

## 2022-05-06 DIAGNOSIS — O09523 Supervision of elderly multigravida, third trimester: Secondary | ICD-10-CM | POA: Diagnosis not present

## 2022-05-06 DIAGNOSIS — O0993 Supervision of high risk pregnancy, unspecified, third trimester: Secondary | ICD-10-CM

## 2022-05-06 DIAGNOSIS — Z3A33 33 weeks gestation of pregnancy: Secondary | ICD-10-CM | POA: Diagnosis not present

## 2022-05-06 DIAGNOSIS — O10919 Unspecified pre-existing hypertension complicating pregnancy, unspecified trimester: Secondary | ICD-10-CM

## 2022-05-06 DIAGNOSIS — I10 Essential (primary) hypertension: Secondary | ICD-10-CM

## 2022-05-06 LAB — POCT URINALYSIS DIPSTICK OB
Blood, UA: NEGATIVE
Glucose, UA: NEGATIVE
Ketones, UA: NEGATIVE
Leukocytes, UA: NEGATIVE
Nitrite, UA: NEGATIVE

## 2022-05-06 NOTE — Progress Notes (Addendum)
[place] 0000000 wks,cephalic,BPP [id number] placenta gr 3,FHR 142 bpm,AFI 15.9 cm,RI .58,.58,.54=38%,EFW 2169 g 28%

## 2022-05-06 NOTE — Progress Notes (Signed)
[place] PREGNANCY VISIT Patient name: Kayla Fernandez MRN [id number]  Date of birth: Dec 09, 1986 Chief Complaint:   Routine Prenatal Visit, High Risk Gestation, and Pregnancy Ultrasound  History of Present Illness:   Kayla Fernandez is a 36 y.o. G77P2002 female at [redacted]w[redacted]d with an Estimated Date of Delivery: 06/18/22 being seen today for ongoing management of a high-risk pregnancy complicated by: -chronic HTN- on Lab 200mg  bid and ASA    Today she reports no complaints.   Contractions: Not present. Vag. Bleeding: None.  Movement: Present. denies leaking of fluid.      03/18/2022   10:41 AM 01/22/2022   10:35 AM 07/19/2019    9:50 AM 04/12/2018    1:52 PM 03/24/2017    8:48 AM  Depression screen PHQ 2/9  Decreased Interest 0 0 0 0 0  Down, Depressed, Hopeless 0 0 0 0 0  PHQ - 2 Score 0 0 0 0 0  Altered sleeping 0 0     Tired, decreased energy 0 0     Change in appetite 0 0     Feeling bad or failure about yourself  0 0     Trouble concentrating 0 0     Moving slowly or fidgety/restless 0 0     Suicidal thoughts 0 0     PHQ-9 Score 0 0        Current Outpatient Medications  Medication Instructions   aspirin EC 162 mg, Oral, Daily   Blood Pressure Monitoring (BLOOD PRESSURE MONITOR AUTOMAT) DEVI Take BP at home daily.  Alert [place] if >140/90 more than once.   labetalol (NORMODYNE) 200 mg, Oral, 2 times daily   Prenatal Vit-Iron Carbonyl-FA (PRENATABS RX) 29-1 MG TABS 1 tablet, Oral, Daily     Review of Systems:   Pertinent items are noted in HPI Denies abnormal vaginal discharge w/ itching/odor/irritation, headaches, visual changes, shortness of breath, chest pain, abdominal pain, severe nausea/vomiting, or problems with urination or bowel movements unless otherwise stated above. Pertinent History Reviewed:  Reviewed past medical,surgical, social, obstetrical and family history.  Reviewed problem list, medications and allergies. Physical Assessment:   Vitals:   05/06/22 1034  BP:  118/73  Pulse: 79  Weight: 191 lb 12.8 oz (87 kg)  Body mass index is 31.92 kg/m.           Physical Examination:   General appearance: alert, well appearing, and in no distress  Mental status: normal mood, behavior, speech, dress, motor activity, and thought processes  Skin: warm & dry   Extremities: Edema: None    Cardiovascular: normal heart rate noted  Respiratory: normal respiratory effort, no distress  Abdomen: gravid, soft, non-tender  Pelvic: Cervical exam deferred         Fetal Status:     Movement: Present    Fetal Surveillance Testing today: [place]- [place] 0000000 wks,cephalic,BPP 99991111 placenta gr 3,FHR 142 bpm,AFI 15.9 cm,RI .58,.58,.54=38%,EFW 2169 g 28%   Chaperone: N/A    Results for orders placed or performed in visit on 05/06/22 (from the past 24 hour(s))  POC Urinalysis Dipstick OB   Collection Time: 05/06/22 10:36 AM  Result Value Ref Range   Color, UA     Clarity, UA     Glucose, UA Negative Negative   Bilirubin, UA     Ketones, UA neg    Spec Grav, UA     Blood, UA neg    pH, UA     POC,PROTEIN,UA Trace Negative, Trace, Small (1+), Moderate (2+),  Large (3+), 4+   Urobilinogen, UA     Nitrite, UA neg    Leukocytes, UA Negative Negative   Appearance     Odor       Assessment & Plan:  High-risk pregnancy: G3P2002 at [redacted]w[redacted]d with an Estimated Date of Delivery: 06/18/22   1) chronic HTN -BP wnl -continue current medication -normal [place] today, BPP 8/8 -continue growth and antepartum screening -pt aware of IOL 37-39wk pending maternal/fetal status  Meds: No orders of the defined types were placed in this encounter.   Labs/procedures today: growth  Treatment Plan:  as outlined above  Reviewed: Preterm labor symptoms and general obstetric precautions including but not limited to vaginal bleeding, contractions, leaking of fluid and fetal movement were reviewed in detail with the patient.  All questions were answered. Pt has home bp cuff. Check bp weekly,  let us know if >140/90.   Follow-up: Return in about 2 weeks (around 05/20/2022) for HROB visit- continue growth every 4wks, BPP weekly or NST twice weekly.   Future Appointments  Date Time Provider [place]  05/11/2022  2:15 PM [facility] - FTOBGYN [place] [facility] None  05/11/2022  2:50 PM Florian Buff, MD [facility]FT FTOBGYN  05/13/2022  9:50 AM [facility]FTOBGYN NURSE [facility] FTOBGYN  05/13/2022 10:10 AM Florian Buff, MD [facility]FT FTOBGYN  05/17/2022  9:30 AM [facility]FTOBGYN NURSE [facility] FTOBGYN  05/20/2022  3:45 PM [place] - FTOBGYN [place] [facility] None  05/20/2022  4:30 PM Janyth Pupa, DO [facility]-FT FTOBGYN  05/24/2022  9:30 AM [facility]-FTOBGYN NURSE [facility]FT FTOBGYN  05/27/2022 11:30 AM [facility] - FTOBGYN [place] [facility] None  05/27/2022 12:15 PM Florian Buff, MD [facility]FT FTOBGYN  05/31/2022  9:50 AM [facility]-FTOBGYN NURSE [facility]-FT FTOBGYN  06/03/2022  9:15 AM [facility] NURSE [facility] [place]  06/03/2022  9:30 AM [facility] US3 [facility]  06/03/2022 11:50 AM Florian Buff, MD [facility]-FT FTOBGYN  06/07/2022  9:30 AM [facility]-FTOBGYN NURSE [facility]-FT FTOBGYN  06/10/2022 10:00 AM [facility] - FTOBGYN [place] [facility] None  06/10/2022 11:10 AM Janyth Pupa, DO [facility]-FT FTOBGYN  06/14/2022  9:50 AM [facility]-FTOBGYN NURSE [facility]-FT FTOBGYN  06/18/2022  9:15 AM [facility] - FTOBGYN [place] [facility]G None  06/18/2022 10:10 AM Florian Buff, MD [facility]-FT FTOBGYN    Orders Placed This Encounter  Procedures   POC Urinalysis Dipstick OB    Janyth Pupa, DO Attending [place], [place] for [employer] [employer] [facility]

## 2022-05-11 ENCOUNTER — Encounter: Payer: Self-pay | Admitting: Obstetrics & Gynecology

## 2022-05-11 ENCOUNTER — Ambulatory Visit (INDEPENDENT_AMBULATORY_CARE_PROVIDER_SITE_OTHER): Payer: Medicaid Other

## 2022-05-11 ENCOUNTER — Other Ambulatory Visit: Payer: Medicaid Other

## 2022-05-11 ENCOUNTER — Ambulatory Visit (INDEPENDENT_AMBULATORY_CARE_PROVIDER_SITE_OTHER): Payer: Medicaid Other | Admitting: Obstetrics & Gynecology

## 2022-05-11 VITALS — BP 121/67 | HR 75 | Wt 196.0 lb

## 2022-05-11 DIAGNOSIS — I10 Essential (primary) hypertension: Secondary | ICD-10-CM

## 2022-05-11 DIAGNOSIS — O0993 Supervision of high risk pregnancy, unspecified, third trimester: Secondary | ICD-10-CM

## 2022-05-11 DIAGNOSIS — Z23 Encounter for immunization: Secondary | ICD-10-CM | POA: Diagnosis not present

## 2022-05-11 DIAGNOSIS — Z3A34 34 weeks gestation of pregnancy: Secondary | ICD-10-CM

## 2022-05-11 DIAGNOSIS — O10919 Unspecified pre-existing hypertension complicating pregnancy, unspecified trimester: Secondary | ICD-10-CM

## 2022-05-11 DIAGNOSIS — O09523 Supervision of elderly multigravida, third trimester: Secondary | ICD-10-CM

## 2022-05-11 LAB — POCT URINALYSIS DIPSTICK OB
Blood, UA: NEGATIVE
Glucose, UA: NEGATIVE
Ketones, UA: NEGATIVE
Leukocytes, UA: NEGATIVE
Nitrite, UA: NEGATIVE
POC,PROTEIN,UA: NEGATIVE

## 2022-05-11 MED ORDER — LABETALOL HCL 200 MG PO TABS: 200.0000 mg | ORAL_TABLET | Freq: Two times a day (BID) | ORAL | 3 refills | Status: DC

## 2022-05-11 NOTE — Progress Notes (Signed)
[place] PREGNANCY VISIT Patient name: Kayla Fernandez MRN [id number]  Date of birth: 04-25-1986 Chief Complaint:   Routine Prenatal Visit, High Risk Gestation, and Pregnancy Ultrasound  History of Present Illness:   Kayla Fernandez is a 36 y.o. G74P2002 female at [redacted]w[redacted]d with an Estimated Date of Delivery: 06/18/22 being seen today for ongoing management of a high-risk pregnancy complicated by chronic hypertension currently on labetalol 200 mg BID.    Today she reports no complaints. Contractions: Not present. Vag. Bleeding: None.  Movement: Present. denies leaking of fluid.      03/18/2022   10:41 AM 01/22/2022   10:35 AM 07/19/2019    9:50 AM 04/12/2018    1:52 PM 03/24/2017    8:48 AM  Depression screen PHQ 2/9  Decreased Interest 0 0 0 0 0  Down, Depressed, Hopeless 0 0 0 0 0  PHQ - 2 Score 0 0 0 0 0  Altered sleeping 0 0     Tired, decreased energy 0 0     Change in appetite 0 0     Feeling bad or failure about yourself  0 0     Trouble concentrating 0 0     Moving slowly or fidgety/restless 0 0     Suicidal thoughts 0 0     PHQ-9 Score 0 0           03/18/2022   10:41 AM 01/22/2022   10:35 AM  GAD 7 : Generalized Anxiety Score  Nervous, Anxious, on Edge 0 0  Control/stop worrying 0 0  Worry too much - different things 0 0  Trouble relaxing 0 0  Restless 0 0  Easily annoyed or irritable 0 0  Afraid - awful might happen 0 0  Total GAD 7 Score 0 0     Review of Systems:   Pertinent items are noted in HPI Denies abnormal vaginal discharge w/ itching/odor/irritation, headaches, visual changes, shortness of breath, chest pain, abdominal pain, severe nausea/vomiting, or problems with urination or bowel movements unless otherwise stated above. Pertinent History Reviewed:  Reviewed past medical,surgical, social, obstetrical and family history.  Reviewed problem list, medications and allergies. Physical Assessment:   Vitals:   05/11/22 1457  BP: 121/67  Pulse: 75  Weight:  196 lb (88.9 kg)  Body mass index is 32.62 kg/m.           Physical Examination:   General appearance: alert, well appearing, and in no distress  Mental status: alert, oriented to person, place, and time  Skin: warm & dry   Extremities: Edema: None    Cardiovascular: normal heart rate noted  Respiratory: normal respiratory effort, no distress  Abdomen: gravid, soft, non-tender  Pelvic: Cervical exam deferred         Fetal Status:     Movement: Present    Fetal Surveillance Testing today: BPP 8/8 with normal Dopplers   Chaperone: N/A    Results for orders placed or performed in visit on 05/11/22 (from the past 24 hour(s))  POC Urinalysis Dipstick OB   Collection Time: 05/11/22  3:08 PM  Result Value Ref Range   Color, UA     Clarity, UA     Glucose, UA Negative Negative   Bilirubin, UA     Ketones, UA neg    Spec Grav, UA     Blood, UA neg    pH, UA     POC,PROTEIN,UA Negative Negative, Trace, Small (1+), Moderate (2+), Large (3+), 4+  Urobilinogen, UA     Nitrite, UA neg    Leukocytes, UA Negative Negative   Appearance     Odor      Assessment & Plan:  High-risk pregnancy: G3P2002 at [redacted]w[redacted]d with an Estimated Date of Delivery: 06/18/22      ICD-10-CM   1. High-risk pregnancy in third trimester  O09.93 POC Urinalysis Dipstick OB    2. Chronic hypertension  I10    labetalol 200 BID         Meds:  Meds ordered this encounter  Medications   labetalol (NORMODYNE) 200 MG tablet    Sig: Take 1 tablet (200 mg total) by mouth 2 (two) times daily.    Dispense:  60 tablet    Refill:  3    Orders:  Orders Placed This Encounter  Procedures   Tdap vaccine greater than or equal to 7yo IM   POC Urinalysis Dipstick OB     Labs/procedures today: U/S  Treatment Plan:  twice weekly surveillance  Reviewed: Preterm labor symptoms and general obstetric precautions including but not limited to vaginal bleeding, contractions, leaking of fluid and fetal movement were  reviewed in detail with the patient.  All questions were answered. Does have home bp cuff. Office bp cuff given: not applicable. Check bp twice daily, let us know if consistently >140 and/or >90.  Follow-up: Return for keep scheduled.   Future Appointments  Date Time Provider [place]  05/13/2022  9:50 AM [facility]-FTOBGYN NURSE [facility]FT FTOBGYN  05/13/2022 10:10 AM Florian Buff, MD [facility]-FT FTOBGYN  05/17/2022  9:30 AM [facility]-FTOBGYN NURSE [facility]FT FTOBGYN  05/20/2022  3:45 PM [place] - FTOBGYN [place] [facility]-FT[facility]G None  05/20/2022  4:30 PM Janyth Pupa, DO [facility]-FT FTOBGYN  05/24/2022  9:30 AM [facility]-FTOBGYN NURSE [facility]-FT FTOBGYN  05/27/2022 11:30 AM [facility] - FTOBGYN [place] [facility]G None  05/27/2022 12:15 PM Florian Buff, MD [facility]FT FTOBGYN  05/31/2022  9:50 AM [facility]-FTOBGYN NURSE [facility]-FT FTOBGYN  06/03/2022  9:15 AM [facility]MFC NURSE [facility]-MFC [place]  06/03/2022  9:30 AM [facility] US3 [facility]  06/03/2022 11:50 AM Florian Buff, MD C[facility]-FT FTOBGYN  06/07/2022  9:30 AM [facility]-FTOBGYN NURSE [facility]-FT FTOBGYN  06/10/2022 10:00 AM [facility] - FTOBGYN [place] [facility] None  06/10/2022 11:10 AM Janyth Pupa, DO [facility]-FT FTOBGYN  06/14/2022  9:50 AM [facility]-FTOBGYN NURSE [facility]-FT FTOBGYN  06/18/2022  9:15 AM [facility] - FTOBGYN [place] [facility]FT[facility]G None  06/18/2022 10:10 AM Florian Buff, MD C[facility]-FT FTOBGYN    Orders Placed This Encounter  Procedures   Tdap vaccine greater than or equal to 7yo IM   POC Urinalysis Dipstick OB   Florian Buff  Attending Physician for the Center for [place] [facility] 05/11/2022 3:33 PM

## 2022-05-11 NOTE — Progress Notes (Signed)
[place] 34+4 wks,cephalic,BPP 8/8,posterior placenta gr 3,AFI 16.5 cm,FHR 132 bpm,RI .62,.55,.59,.64=55%

## 2022-05-12 ENCOUNTER — Encounter: Payer: Self-pay | Admitting: Obstetrics & Gynecology

## 2022-05-13 ENCOUNTER — Encounter: Payer: Self-pay | Admitting: Obstetrics & Gynecology

## 2022-05-13 ENCOUNTER — Ambulatory Visit (INDEPENDENT_AMBULATORY_CARE_PROVIDER_SITE_OTHER): Payer: Medicaid Other | Admitting: Obstetrics & Gynecology

## 2022-05-13 ENCOUNTER — Other Ambulatory Visit: Payer: Medicaid Other

## 2022-05-13 VITALS — BP 120/78 | HR 74 | Wt 194.0 lb

## 2022-05-13 DIAGNOSIS — O0993 Supervision of high risk pregnancy, unspecified, third trimester: Secondary | ICD-10-CM

## 2022-05-13 DIAGNOSIS — K0889 Other specified disorders of teeth and supporting structures: Secondary | ICD-10-CM

## 2022-05-13 DIAGNOSIS — I10 Essential (primary) hypertension: Secondary | ICD-10-CM

## 2022-05-13 LAB — POCT URINALYSIS DIPSTICK OB
Blood, UA: NEGATIVE
Glucose, UA: NEGATIVE
Ketones, UA: NEGATIVE
Leukocytes, UA: NEGATIVE
Nitrite, UA: NEGATIVE
POC,PROTEIN,UA: NEGATIVE

## 2022-05-13 MED ORDER — CLINDAMYCIN HCL 300 MG PO CAPS: 300.0000 mg | ORAL_CAPSULE | Freq: Three times a day (TID) | ORAL | 0 refills | Status: DC

## 2022-05-13 NOTE — Progress Notes (Signed)
HIGH-RISK PREGNANCY VISIT Patient name: Kayla Fernandez MRN [id number]  Date of birth: 08-Dec-1986 Chief Complaint:   Routine Prenatal Visit, High Risk Gestation, and Non-stress Test  History of Present Illness:   Kayla Fernandez is a 36 y.o. G63P2002 female at [redacted]w[redacted]d with an Estimated Date of Delivery: 06/18/22 being seen today for ongoing management of a high-risk pregnancy complicated by chronic hypertension currently on labetalol 200 BID.    Today she reports no complaints. Contractions: Not present. Vag. Bleeding: None.  Movement: Present. denies leaking of fluid.      03/18/2022   10:41 AM 01/22/2022   10:35 AM 07/19/2019    9:50 AM 04/12/2018    1:52 PM 03/24/2017    8:48 AM  Depression screen PHQ 2/9  Decreased Interest 0 0 0 0 0  Down, Depressed, Hopeless 0 0 0 0 0  PHQ - 2 Score 0 0 0 0 0  Altered sleeping 0 0     Tired, decreased energy 0 0     Change in appetite 0 0     Feeling bad or failure about yourself  0 0     Trouble concentrating 0 0     Moving slowly or fidgety/restless 0 0     Suicidal thoughts 0 0     PHQ-9 Score 0 0           03/18/2022   10:41 AM 01/22/2022   10:35 AM  GAD 7 : Generalized Anxiety Score  Nervous, Anxious, on Edge 0 0  Control/stop worrying 0 0  Worry too much - different things 0 0  Trouble relaxing 0 0  Restless 0 0  Easily annoyed or irritable 0 0  Afraid - awful might happen 0 0  Total GAD 7 Score 0 0     Review of Systems:   Pertinent items are noted in HPI Denies abnormal vaginal discharge w/ itching/odor/irritation, headaches, visual changes, shortness of breath, chest pain, abdominal pain, severe nausea/vomiting, or problems with urination or bowel movements unless otherwise stated above. Pertinent History Reviewed:  Reviewed past medical,surgical, social, obstetrical and family history.  Reviewed problem list, medications and allergies. Physical Assessment:   Vitals:   05/13/22 1003  BP: 120/78  Pulse: 74  Weight: 194 lb  (88 kg)  Body mass index is 32.28 kg/m.           Physical Examination:   General appearance: alert, well appearing, and in no distress  Mental status: alert, oriented to person, place, and time  Skin: warm & dry   Extremities: Edema: None    Cardiovascular: normal heart rate noted  Respiratory: normal respiratory effort, no distress  Abdomen: gravid, soft, non-tender  Pelvic: Cervical exam deferred         Fetal Status:     Movement: Present    Fetal Surveillance Testing today: Reactive NST  Kayla Fernandez is at [redacted]w[redacted]d Estimated Date of Delivery: 06/18/22  NST being performed due to [facility]  Today the NST is Reactive  Fetal Monitoring:  Baseline: 140 bpm, Variability: Good {> 6 bpm), Accelerations: Reactive, and Decelerations: Absent   reactive  The accelerations are >15 bpm and more than 2 in 20 minutes  Final diagnosis:  Reactive NST  Lazaro Arms, MD     Chaperone: N/A    Results for orders placed or performed in visit on 05/13/22 (from the past 24 hour(s))  POC Urinalysis Dipstick OB   Collection Time: 05/13/22 10:09 AM  Result Value Ref  Range   Color, UA     Clarity, UA     Glucose, UA Negative Negative   Bilirubin, UA     Ketones, UA neg    Spec Grav, UA     Blood, UA neg    pH, UA     POC,PROTEIN,UA Negative Negative, Trace, Small (1+), Moderate (2+), Large (3+), 4+   Urobilinogen, UA     Nitrite, UA neg    Leukocytes, UA Negative Negative   Appearance     Odor      Assessment & Plan:  High-risk pregnancy: [id number] at [redacted]w[redacted]d with an Estimated Date of Delivery: 06/18/22      ICD-10-CM   1. High-risk pregnancy in third trimester  O09.93 POC Urinalysis Dipstick OB    2. Chronic hypertension  I10    labetalol 200 BID with good control + ASA 162    3. Toothache, gum infected under a filling  K08.89         Meds:  Meds ordered this encounter  Medications   clindamycin (CLEOCIN) 300 MG capsule    Sig: Take 1 capsule (300 mg total) by mouth 3 (three)  times daily.    Dispense:  30 capsule    Refill:  0    Orders:  Orders Placed This Encounter  Procedures   POC Urinalysis Dipstick OB     Labs/procedures today: NST  Treatment Plan:  twice weekly surveillance, IOL 39 weeks  Reviewed: Preterm labor symptoms and general obstetric precautions including but not limited to vaginal bleeding, contractions, leaking of fluid and fetal movement were reviewed in detail with the patient.  All questions were answered. Does have home bp cuff. Office bp cuff given: not applicable. Check bp twice daily, let us know if consistently >150 and/or >95.  Follow-up: Return for keep scheduled.   Future Appointments  Date Time Provider [place]  05/17/2022  9:30 AM [facility]-FTOBGYN NURSE [facility]-FT FTOBGYN  05/20/2022  3:45 PM [place] - FTOBGYN [place] C[facility]-FTIMG None  05/20/2022  4:30 PM Janyth Pupa, DO C[facility]-FT FTOBGYN  05/24/2022  9:30 AM [facility]-FTOBGYN NURSE C[facility]-FT FTOBGYN  05/27/2022 11:30 AM [facility] - FTOBGYN [place] [facility]-FT[facility]G None  05/27/2022 12:15 PM Florian Buff, MD C[facility]-FT FTOBGYN  05/31/2022  9:50 AM [facility]-FTOBGYN NURSE C[facility]-FT FTOBGYN  06/03/2022  9:15 AM [facility]-MFC NURSE W[facility]-MFC [place]  06/03/2022  9:30 AM [facility]MFC US3 [facility]  06/03/2022 11:50 AM Florian Buff, MD C[facility]-FT FTOBGYN  06/07/2022  9:30 AM [facility]-FTOBGYN NURSE [facility]-FT FTOBGYN  06/10/2022 10:00 AM [facility] - FTOBGYN [place] [facility]-FTIMG None  06/10/2022 11:10 AM Janyth Pupa, DO C[facility]-FT FTOBGYN  06/14/2022  9:50 AM [facility]-FTOBGYN NURSE C[facility]-FT FTOBGYN  06/18/2022  9:15 AM [facility] - FTOBGYN [place] [facility]-FTIMG None  06/18/2022 10:10 AM Florian Buff, MD C[facility]-FT FTOBGYN    Orders Placed This Encounter  Procedures   POC Urinalysis Dipstick OB   Florian Buff  Attending Physician for the [facility] for [place] [facility] 05/13/2022 11:29 AM

## 2022-05-17 ENCOUNTER — Ambulatory Visit (INDEPENDENT_AMBULATORY_CARE_PROVIDER_SITE_OTHER): Payer: Medicaid Other | Admitting: *Deleted

## 2022-05-17 VITALS — BP 113/67 | HR 77

## 2022-05-17 DIAGNOSIS — Z3A35 35 weeks gestation of pregnancy: Secondary | ICD-10-CM

## 2022-05-17 DIAGNOSIS — O0993 Supervision of high risk pregnancy, unspecified, third trimester: Secondary | ICD-10-CM

## 2022-05-17 DIAGNOSIS — I10 Essential (primary) hypertension: Secondary | ICD-10-CM | POA: Diagnosis not present

## 2022-05-17 DIAGNOSIS — O09522 Supervision of elderly multigravida, second trimester: Secondary | ICD-10-CM

## 2022-05-17 LAB — POCT URINALYSIS DIPSTICK OB
Blood, UA: NEGATIVE
Glucose, UA: NEGATIVE
Ketones, UA: NEGATIVE
Leukocytes, UA: NEGATIVE
Nitrite, UA: NEGATIVE
POC,PROTEIN,UA: NEGATIVE

## 2022-05-17 NOTE — Progress Notes (Signed)
   NURSE VISIT- NST  SUBJECTIVE:  Kayla Fernandez is a 36 y.o. G92P2002 female at [redacted]w[redacted]d, here for a NST for pregnancy complicated by [facility].  She reports active fetal movement, contractions: none, vaginal bleeding: none, membranes: intact.   OBJECTIVE:  BP 113/67   Pulse 77   LMP 08/29/2021   Appears well, no apparent distress  Results for orders placed or performed in visit on 05/17/22 (from the past 24 hour(s))  POC Urinalysis Dipstick OB   Collection Time: 05/17/22 12:15 PM  Result Value Ref Range   Color, UA     Clarity, UA     Glucose, UA Negative Negative   Bilirubin, UA     Ketones, UA neg    Spec Grav, UA     Blood, UA neg    pH, UA     POC,PROTEIN,UA Negative Negative, Trace, Small (1+), Moderate (2+), Large (3+), 4+   Urobilinogen, UA     Nitrite, UA neg    Leukocytes, UA Negative Negative   Appearance     Odor      NST: FHR baseline 140 bpm, Variability: moderate, Accelerations:present, Decelerations:  Absent= Cat 1/reactive [place]: none   ASSESSMENT: G3P2002 at [redacted]w[redacted]d with CHTN NST reactive  PLAN: EFM strip reviewed by Dr. Despina Hidden   Recommendations: keep next appointment as scheduled    Jobe Marker  05/17/2022 12:17 PM

## 2022-05-19 ENCOUNTER — Other Ambulatory Visit: Payer: Self-pay | Admitting: Obstetrics & Gynecology

## 2022-05-19 DIAGNOSIS — O10919 Unspecified pre-existing hypertension complicating pregnancy, unspecified trimester: Secondary | ICD-10-CM

## 2022-05-19 DIAGNOSIS — O09523 Supervision of elderly multigravida, third trimester: Secondary | ICD-10-CM

## 2022-05-20 ENCOUNTER — Ambulatory Visit (INDEPENDENT_AMBULATORY_CARE_PROVIDER_SITE_OTHER): Payer: Medicaid Other | Admitting: Obstetrics & Gynecology

## 2022-05-20 ENCOUNTER — Encounter: Payer: Self-pay | Admitting: Obstetrics & Gynecology

## 2022-05-20 ENCOUNTER — Ambulatory Visit (INDEPENDENT_AMBULATORY_CARE_PROVIDER_SITE_OTHER): Payer: Medicaid Other

## 2022-05-20 ENCOUNTER — Other Ambulatory Visit (HOSPITAL_COMMUNITY)
Admission: RE | Admit: 2022-05-20 | Discharge: 2022-05-20 | Disposition: A | Discharge status: Home / Self Care | Payer: Medicaid Other | Source: Ambulatory Visit | Attending: Obstetrics & Gynecology | Admitting: Obstetrics & Gynecology

## 2022-05-20 VITALS — BP 126/75 | HR 72 | Wt 193.4 lb

## 2022-05-20 DIAGNOSIS — Z3A35 35 weeks gestation of pregnancy: Secondary | ICD-10-CM | POA: Insufficient documentation

## 2022-05-20 DIAGNOSIS — O0993 Supervision of high risk pregnancy, unspecified, third trimester: Secondary | ICD-10-CM | POA: Insufficient documentation

## 2022-05-20 DIAGNOSIS — O09523 Supervision of elderly multigravida, third trimester: Secondary | ICD-10-CM

## 2022-05-20 DIAGNOSIS — O10919 Unspecified pre-existing hypertension complicating pregnancy, unspecified trimester: Secondary | ICD-10-CM | POA: Diagnosis not present

## 2022-05-20 LAB — POCT URINALYSIS DIPSTICK OB
Blood, UA: NEGATIVE
Glucose, UA: NEGATIVE
Ketones, UA: NEGATIVE
Leukocytes, UA: NEGATIVE
Nitrite, UA: NEGATIVE
POC,PROTEIN,UA: NEGATIVE

## 2022-05-20 LAB — OB RESULTS CONSOLE GBS: GBS: NEGATIVE

## 2022-05-20 NOTE — Progress Notes (Signed)
[place] PREGNANCY VISIT Patient name: Kayla Fernandez MRN [id number]  Date of birth: 30-Jun-1986 Chief Complaint:   Routine Prenatal Visit  History of Present Illness:   Kayla Fernandez is a 36 y.o. G70P2002 female at [redacted]w[redacted]d with an Estimated Date of Delivery: 06/18/22 being seen today for ongoing management of a high-risk pregnancy complicated by: Chronic HTN- Lab 200mg  bid.    Today she reports no complaints.   Contractions: Not present. Vag. Bleeding: None.  Movement: Present. denies leaking of fluid.      03/18/2022   10:41 AM 01/22/2022   10:35 AM 07/19/2019    9:50 AM 04/12/2018    1:52 PM 03/24/2017    8:48 AM  Depression screen PHQ 2/9  Decreased Interest 0 0 0 0 0  Down, Depressed, Hopeless 0 0 0 0 0  PHQ - 2 Score 0 0 0 0 0  Altered sleeping 0 0     Tired, decreased energy 0 0     Change in appetite 0 0     Feeling bad or failure about yourself  0 0     Trouble concentrating 0 0     Moving slowly or fidgety/restless 0 0     Suicidal thoughts 0 0     PHQ-9 Score 0 0        Current Outpatient Medications  Medication Instructions   aspirin EC 162 mg, Oral, Daily   Blood Pressure Monitoring (BLOOD PRESSURE MONITOR AUTOMAT) DEVI Take BP at home daily.  Alert [place] if >140/90 more than once.   clindamycin (CLEOCIN) 300 mg, Oral, 3 times daily   labetalol (NORMODYNE) 200 mg, Oral, 2 times daily   Prenatal Vit-Iron Carbonyl-FA (PRENATABS RX) 29-1 MG TABS 1 tablet, Oral, Daily     Review of Systems:   Pertinent items are noted in HPI Denies abnormal vaginal discharge w/ itching/odor/irritation, headaches, visual changes, shortness of breath, chest pain, abdominal pain, severe nausea/vomiting, or problems with urination or bowel movements unless otherwise stated above. Pertinent History Reviewed:  Reviewed past medical,surgical, social, obstetrical and family history.  Reviewed problem list, medications and allergies. Physical Assessment:   Vitals:   05/20/22 1627  BP: 126/75   Pulse: 72  Weight: 193 lb 6.4 oz (87.7 kg)  Body mass index is 32.18 kg/m.           Physical Examination:   General appearance: alert, well appearing, and in no distress  Mental status: normal mood, behavior, speech, dress, motor activity, and thought processes  Skin: warm & dry   Extremities: Edema: None    Cardiovascular: normal heart rate noted  Respiratory: normal respiratory effort, no distress  Abdomen: gravid, soft, non-tender  Pelvic: Cervical exam deferred vaginal swabs obtained        Fetal Status:     Movement: Present    Fetal Surveillance Testing today: [place] [id number] wks,cephalic,BPP 99991111 placenta gr 3,AFI 17.7 cm,RI .59,.49,.60,.58=45%,FHR 124 bpm   Chaperone: Celene Squibb    Results for orders placed or performed in visit on 05/20/22 (from the past 24 hour(s))  POC Urinalysis Dipstick OB   Collection Time: 05/20/22  4:32 PM  Result Value Ref Range   Color, UA     Clarity, UA     Glucose, UA Negative Negative   Bilirubin, UA     Ketones, UA neg    Spec Grav, UA     Blood, UA neg    pH, UA     POC,PROTEIN,UA Negative Negative, Trace, Small (1+), Moderate (  2+), Large (3+), 4+   Urobilinogen, UA     Nitrite, UA neg    Leukocytes, UA Negative Negative   Appearance     Odor       Assessment & Plan:  High-risk pregnancy: G3P2002 at [redacted]w[redacted]d with an Estimated Date of Delivery: 06/18/22   1) cHTN -continue current medication -continue weekly testing, [place] today appropriate -reviewed IOL 37-39wk pending BP control  Meds: No orders of the defined types were placed in this encounter.   Labs/procedures today: GBS, GC/C  Treatment Plan:  as outlined above  Reviewed: Preterm labor symptoms and general obstetric precautions including but not limited to vaginal bleeding, contractions, leaking of fluid and fetal movement were reviewed in detail with the patient.  All questions were answered. Pt has home bp cuff. Check bp weekly, let us know if >140/90.    Follow-up: No follow-ups on file.   Future Appointments  Date Time Provider [place]  05/24/2022  9:30 AM [facility]-FTOBGYN NURSE C[facility]-FT FTOBGYN  05/27/2022 11:30 AM [facility] - FTOBGYN [place] [facility]-FTIMG None  05/27/2022 12:15 PM Florian Buff, MD C[facility]-FT FTOBGYN  05/31/2022  9:50 AM [facility]-FTOBGYN NURSE C[facility]-FT FTOBGYN  06/03/2022  9:15 AM [facility]-MFC NURSE W[facility]-MFC [place]  06/03/2022  9:30 AM [facility]MFC US3 [facility]  06/03/2022 11:50 AM Florian Buff, MD CWH-FT FTOBGYN  06/07/2022  9:30 AM [facility]-FTOBGYN NURSE C[facility]-FT FTOBGYN  06/10/2022 10:00 AM [facility] - FTOBGYN [place] [facility]-FTIMG None  06/10/2022 11:10 AM Janyth Pupa, DO C[facility]-FT FTOBGYN  06/14/2022  9:50 AM [facility]-FTOBGYN NURSE C[facility]-FT FTOBGYN  06/18/2022  9:15 AM [facility] - FTOBGYN [place] [facility]-FTIMG None  06/18/2022 10:10 AM Florian Buff, MD [facility]-FT FTOBGYN    Orders Placed This Encounter  Procedures   POC Urinalysis Dipstick OB    Janyth Pupa, DO Attending [place], [place] for [employer] [employer] [facility]

## 2022-05-20 NOTE — Progress Notes (Signed)
[place] 35+6 wks,cephalic,BPP 8/8,posterior placenta gr 3,AFI 17.7 cm,RI .59,.49,.60,.58=45%,FHR 124 bpm

## 2022-05-23 LAB — STREP GP B NAA+RFLX: Strep Gp B NAA+Rflx: NEGATIVE

## 2022-05-24 ENCOUNTER — Ambulatory Visit (INDEPENDENT_AMBULATORY_CARE_PROVIDER_SITE_OTHER): Payer: Medicaid Other | Admitting: *Deleted

## 2022-05-24 VITALS — BP 118/68 | HR 76 | Wt 192.6 lb

## 2022-05-24 DIAGNOSIS — Z1389 Encounter for screening for other disorder: Secondary | ICD-10-CM

## 2022-05-24 DIAGNOSIS — Z331 Pregnant state, incidental: Secondary | ICD-10-CM

## 2022-05-24 DIAGNOSIS — O0993 Supervision of high risk pregnancy, unspecified, third trimester: Secondary | ICD-10-CM

## 2022-05-24 DIAGNOSIS — O288 Other abnormal findings on antenatal screening of mother: Secondary | ICD-10-CM

## 2022-05-24 DIAGNOSIS — I10 Essential (primary) hypertension: Secondary | ICD-10-CM

## 2022-05-24 LAB — POCT URINALYSIS DIPSTICK OB
Blood, UA: NEGATIVE
Glucose, UA: NEGATIVE
Leukocytes, UA: NEGATIVE
Nitrite, UA: NEGATIVE

## 2022-05-24 LAB — CERVICOVAGINAL ANCILLARY ONLY
Chlamydia: NEGATIVE
Comment: NEGATIVE
Comment: NORMAL
Neisseria Gonorrhea: NEGATIVE

## 2022-05-24 NOTE — Progress Notes (Signed)
   NURSE VISIT- NST  SUBJECTIVE:  Kayla Fernandez is a 36 y.o. G46P2002 female at [redacted]w[redacted]d, here for a NST for pregnancy complicated by [facility].  She reports active fetal movement, contractions: none, vaginal bleeding: none, membranes: intact.   OBJECTIVE:  BP 118/68   Pulse 76   Wt 192 lb 9.6 oz (87.4 kg)   LMP 08/29/2021   BMI 32.05 kg/m   Appears well, no apparent distress  Results for orders placed or performed in visit on 05/24/22 (from the past 24 hour(s))  POC Urinalysis Dipstick OB   Collection Time: 05/24/22  9:59 AM  Result Value Ref Range   Color, UA     Clarity, UA     Glucose, UA Negative Negative   Bilirubin, UA     Ketones, UA small    Spec Grav, UA     Blood, UA neg    pH, UA     POC,PROTEIN,UA Trace Negative, Trace, Small (1+), Moderate (2+), Large (3+), 4+   Urobilinogen, UA     Nitrite, UA neg    Leukocytes, UA Negative Negative   Appearance     Odor      NST: FHR baseline 140 bpm, Variability: moderate, Accelerations:present, Decelerations:  Absent= Cat 1/reactive Toco: none   ASSESSMENT: G3P2002 at [redacted]w[redacted]d with CHTN NST reactive  PLAN: EFM strip reviewed by Knute Neu, CNM, [facility]   Recommendations: keep next appointment as scheduled    Alice Rieger  05/24/2022 12:15 PM

## 2022-05-26 ENCOUNTER — Other Ambulatory Visit: Payer: Self-pay | Admitting: Obstetrics & Gynecology

## 2022-05-26 DIAGNOSIS — O10919 Unspecified pre-existing hypertension complicating pregnancy, unspecified trimester: Secondary | ICD-10-CM

## 2022-05-27 ENCOUNTER — Ambulatory Visit (INDEPENDENT_AMBULATORY_CARE_PROVIDER_SITE_OTHER): Payer: Medicaid Other | Admitting: Obstetrics & Gynecology

## 2022-05-27 ENCOUNTER — Other Ambulatory Visit: Payer: Medicaid Other

## 2022-05-27 ENCOUNTER — Encounter: Payer: Self-pay | Admitting: Obstetrics & Gynecology

## 2022-05-27 ENCOUNTER — Encounter: Payer: Medicaid Other | Admitting: Obstetrics & Gynecology

## 2022-05-27 ENCOUNTER — Ambulatory Visit: Payer: Medicaid Other

## 2022-05-27 ENCOUNTER — Ambulatory Visit (INDEPENDENT_AMBULATORY_CARE_PROVIDER_SITE_OTHER): Payer: Medicaid Other

## 2022-05-27 VITALS — BP 123/73 | HR 64 | Wt 194.0 lb

## 2022-05-27 DIAGNOSIS — O10919 Unspecified pre-existing hypertension complicating pregnancy, unspecified trimester: Secondary | ICD-10-CM

## 2022-05-27 DIAGNOSIS — I10 Essential (primary) hypertension: Secondary | ICD-10-CM

## 2022-05-27 DIAGNOSIS — Z3A36 36 weeks gestation of pregnancy: Secondary | ICD-10-CM

## 2022-05-27 DIAGNOSIS — O0993 Supervision of high risk pregnancy, unspecified, third trimester: Secondary | ICD-10-CM

## 2022-05-27 DIAGNOSIS — O09523 Supervision of elderly multigravida, third trimester: Secondary | ICD-10-CM

## 2022-05-27 NOTE — Progress Notes (Signed)
[place] 36+6 wks,cephalic,posterior placenta gr 3,RI .65,.60=78% (limited because of continuous practice breathing),BPP 8/8,AFI 12 cm,FHR 137 bpm

## 2022-05-27 NOTE — Progress Notes (Signed)
HIGH-RISK PREGNANCY VISIT Patient name: Kayla Fernandez MRN [id number]  Date of birth: 02-04-1986 Chief Complaint:   Routine Prenatal Visit ([place] today)  History of Present Illness:   Kayla Fernandez is a 36 y.o. G42P2002 female at [redacted]w[redacted]d with an Estimated Date of Delivery: 06/18/22 being seen today for ongoing management of a high-risk pregnancy complicated by chronic hypertension currently on labetalol 200 BID.    Today she reports no complaints. Contractions: Not present. Vag. Bleeding: None.  Movement: Present. denies leaking of fluid.      03/18/2022   10:41 AM 01/22/2022   10:35 AM 07/19/2019    9:50 AM 04/12/2018    1:52 PM 03/24/2017    8:48 AM  Depression screen PHQ 2/9  Decreased Interest 0 0 0 0 0  Down, Depressed, Hopeless 0 0 0 0 0  PHQ - 2 Score 0 0 0 0 0  Altered sleeping 0 0     Tired, decreased energy 0 0     Change in appetite 0 0     Feeling bad or failure about yourself  0 0     Trouble concentrating 0 0     Moving slowly or fidgety/restless 0 0     Suicidal thoughts 0 0     PHQ-9 Score 0 0           03/18/2022   10:41 AM 01/22/2022   10:35 AM  GAD 7 : Generalized Anxiety Score  Nervous, Anxious, on Edge 0 0  Control/stop worrying 0 0  Worry too much - different things 0 0  Trouble relaxing 0 0  Restless 0 0  Easily annoyed or irritable 0 0  Afraid - awful might happen 0 0  Total GAD 7 Score 0 0     Review of Systems:   Pertinent items are noted in HPI Denies abnormal vaginal discharge w/ itching/odor/irritation, headaches, visual changes, shortness of breath, chest pain, abdominal pain, severe nausea/vomiting, or problems with urination or bowel movements unless otherwise stated above. Pertinent History Reviewed:  Reviewed past medical,surgical, social, obstetrical and family history.  Reviewed problem list, medications and allergies. Physical Assessment:   Vitals:   05/27/22 1133  BP: 123/73  Pulse: 64  Weight: 194 lb (88 kg)  Body mass index is  32.28 kg/m.           Physical Examination:   General appearance: alert, well appearing, and in no distress  Mental status: alert, oriented to person, place, and time  Skin: warm & dry   Extremities: Edema: None    Cardiovascular: normal heart rate noted  Respiratory: normal respiratory effort, no distress  Abdomen: gravid, soft, non-tender  Pelvic: Cervical exam deferred         Fetal Status:     Movement: Present    Fetal Surveillance Testing today: BPP 8/8 Dopplers 78%   Chaperone: N/A    No results found for this or any previous visit (from the past 24 hour(s)).  Assessment & Plan:  High-risk pregnancy: G3P2002 at [redacted]w[redacted]d with an Estimated Date of Delivery: 06/18/22      ICD-10-CM   1. Supervision of high risk pregnancy in third trimester  O09.93     2. Chronic hypertension  I10    labetalol 200 BID, reassuring surveillance         Meds: No orders of the defined types were placed in this encounter.   Orders: No orders of the defined types were placed in this encounter.  Labs/procedures today: U/S  Treatment Plan:  twice weekly surveillance    Follow-up: Return for keep scheduled.   Future Appointments  Date Time Provider Department Center  05/27/2022 12:15 PM Lazaro Arms, MD [facility]  05/31/2022  9:50 AM [facility] NURSE [facility]  06/03/2022  9:15 AM [facility] NURSE [facility]  06/03/2022  9:30 AM [facility] US3 [facility]  06/03/2022 11:50 AM Lazaro Arms, MD [facility]  06/07/2022  9:30 AM [facility] NURSE [facility]  06/10/2022 10:00 AM [facility] [place] [facility] None  06/10/2022 11:10 AM Myna Hidalgo, DO [facility]  06/14/2022  9:50 AM [facility] NURSE [facility]  06/18/2022  9:15 AM [facility] [place] [facility] None  06/18/2022 10:10 AM Lazaro Arms, MD [facility]    No orders of the defined types were placed in this encounter.  Lazaro Arms  Attending Physician for the Center for [facility] 05/27/2022 12:12 PM

## 2022-05-28 ENCOUNTER — Encounter: Payer: Medicaid Other | Admitting: Obstetrics & Gynecology

## 2022-05-31 ENCOUNTER — Ambulatory Visit (INDEPENDENT_AMBULATORY_CARE_PROVIDER_SITE_OTHER): Payer: Medicaid Other | Admitting: *Deleted

## 2022-05-31 VITALS — BP 109/67 | HR 69

## 2022-05-31 DIAGNOSIS — Z3A37 37 weeks gestation of pregnancy: Secondary | ICD-10-CM

## 2022-05-31 DIAGNOSIS — I10 Essential (primary) hypertension: Secondary | ICD-10-CM

## 2022-05-31 DIAGNOSIS — O09522 Supervision of elderly multigravida, second trimester: Secondary | ICD-10-CM

## 2022-05-31 DIAGNOSIS — O0993 Supervision of high risk pregnancy, unspecified, third trimester: Secondary | ICD-10-CM

## 2022-05-31 LAB — POCT URINALYSIS DIPSTICK OB
Blood, UA: NEGATIVE
Glucose, UA: NEGATIVE
Ketones, UA: NEGATIVE
Leukocytes, UA: NEGATIVE
Nitrite, UA: NEGATIVE
POC,PROTEIN,UA: NEGATIVE

## 2022-05-31 NOTE — Progress Notes (Signed)
   NURSE VISIT- NST  SUBJECTIVE:  Kayla Fernandez is a 36 y.o. G14P2002 female at [redacted]w[redacted]d, here for a NST for pregnancy complicated by [facility].  She reports active fetal movement, contractions: none, vaginal bleeding: none, membranes: intact.   OBJECTIVE:  BP 109/67   Pulse 69   LMP 08/29/2021   Appears well, no apparent distress  Results for orders placed or performed in visit on 05/31/22 (from the past 24 hour(s))  POC Urinalysis Dipstick OB   Collection Time: 05/31/22 10:22 AM  Result Value Ref Range   Color, UA     Clarity, UA     Glucose, UA Negative Negative   Bilirubin, UA     Ketones, UA neg    Spec Grav, UA     Blood, UA neg    pH, UA     POC,PROTEIN,UA Negative Negative, Trace, Small (1+), Moderate (2+), Large (3+), 4+   Urobilinogen, UA     Nitrite, UA neg    Leukocytes, UA Negative Negative   Appearance     Odor      NST: FHR baseline 130 bpm, Variability: moderate, Accelerations:present, Decelerations:  Absent= Cat 1/reactive Toco: occasional   ASSESSMENT: G3P2002 at [redacted]w[redacted]d with CHTN NST reactive  PLAN: EFM strip reviewed by Joellyn Haff, CNM, [facility]   Recommendations: keep next appointment as scheduled    Jobe Marker  05/31/2022 11:06 AM

## 2022-06-02 ENCOUNTER — Other Ambulatory Visit: Payer: Self-pay | Admitting: *Deleted

## 2022-06-02 DIAGNOSIS — I10 Essential (primary) hypertension: Secondary | ICD-10-CM

## 2022-06-02 DIAGNOSIS — O0993 Supervision of high risk pregnancy, unspecified, third trimester: Secondary | ICD-10-CM

## 2022-06-03 ENCOUNTER — Ambulatory Visit (HOSPITAL_BASED_OUTPATIENT_CLINIC_OR_DEPARTMENT_OTHER): Payer: Medicaid Other

## 2022-06-03 ENCOUNTER — Ambulatory Visit: Payer: Medicaid Other

## 2022-06-03 ENCOUNTER — Ambulatory Visit (INDEPENDENT_AMBULATORY_CARE_PROVIDER_SITE_OTHER): Payer: Medicaid Other | Admitting: Obstetrics & Gynecology

## 2022-06-03 ENCOUNTER — Other Ambulatory Visit: Payer: Medicaid Other

## 2022-06-03 ENCOUNTER — Encounter: Payer: Self-pay | Admitting: *Deleted

## 2022-06-03 ENCOUNTER — Ambulatory Visit: Payer: Medicaid Other | Attending: Obstetrics and Gynecology | Admitting: *Deleted

## 2022-06-03 ENCOUNTER — Encounter: Payer: Medicaid Other | Admitting: Obstetrics & Gynecology

## 2022-06-03 ENCOUNTER — Encounter: Payer: Self-pay | Admitting: Obstetrics & Gynecology

## 2022-06-03 VITALS — BP 121/77 | HR 73 | Wt 192.0 lb

## 2022-06-03 VITALS — BP 120/69 | HR 65

## 2022-06-03 DIAGNOSIS — O10913 Unspecified pre-existing hypertension complicating pregnancy, third trimester: Secondary | ICD-10-CM | POA: Insufficient documentation

## 2022-06-03 DIAGNOSIS — Z368A Encounter for antenatal screening for other genetic defects: Secondary | ICD-10-CM | POA: Insufficient documentation

## 2022-06-03 DIAGNOSIS — O99213 Obesity complicating pregnancy, third trimester: Secondary | ICD-10-CM | POA: Diagnosis not present

## 2022-06-03 DIAGNOSIS — Z3A37 37 weeks gestation of pregnancy: Secondary | ICD-10-CM | POA: Insufficient documentation

## 2022-06-03 DIAGNOSIS — E669 Obesity, unspecified: Secondary | ICD-10-CM | POA: Diagnosis not present

## 2022-06-03 DIAGNOSIS — O10013 Pre-existing essential hypertension complicating pregnancy, third trimester: Secondary | ICD-10-CM | POA: Diagnosis not present

## 2022-06-03 DIAGNOSIS — O09522 Supervision of elderly multigravida, second trimester: Secondary | ICD-10-CM

## 2022-06-03 DIAGNOSIS — O0993 Supervision of high risk pregnancy, unspecified, third trimester: Secondary | ICD-10-CM

## 2022-06-03 DIAGNOSIS — O09523 Supervision of elderly multigravida, third trimester: Secondary | ICD-10-CM

## 2022-06-03 DIAGNOSIS — O10919 Unspecified pre-existing hypertension complicating pregnancy, unspecified trimester: Secondary | ICD-10-CM

## 2022-06-03 LAB — POCT URINALYSIS DIPSTICK OB
Blood, UA: NEGATIVE
Glucose, UA: NEGATIVE
Ketones, UA: NEGATIVE
Leukocytes, UA: NEGATIVE
Nitrite, UA: NEGATIVE
POC,PROTEIN,UA: NEGATIVE

## 2022-06-07 ENCOUNTER — Ambulatory Visit (INDEPENDENT_AMBULATORY_CARE_PROVIDER_SITE_OTHER): Payer: Medicaid Other | Admitting: *Deleted

## 2022-06-07 VITALS — BP 121/77 | HR 68 | Wt 191.0 lb

## 2022-06-07 DIAGNOSIS — I10 Essential (primary) hypertension: Secondary | ICD-10-CM

## 2022-06-07 DIAGNOSIS — Z331 Pregnant state, incidental: Secondary | ICD-10-CM

## 2022-06-07 DIAGNOSIS — Z1389 Encounter for screening for other disorder: Secondary | ICD-10-CM

## 2022-06-07 DIAGNOSIS — Z3A38 38 weeks gestation of pregnancy: Secondary | ICD-10-CM

## 2022-06-07 DIAGNOSIS — O288 Other abnormal findings on antenatal screening of mother: Secondary | ICD-10-CM

## 2022-06-07 DIAGNOSIS — O0993 Supervision of high risk pregnancy, unspecified, third trimester: Secondary | ICD-10-CM

## 2022-06-07 LAB — POCT URINALYSIS DIPSTICK OB
Blood, UA: NEGATIVE
Glucose, UA: NEGATIVE
Leukocytes, UA: NEGATIVE
Nitrite, UA: NEGATIVE
POC,PROTEIN,UA: NEGATIVE

## 2022-06-07 NOTE — Progress Notes (Signed)
   NURSE VISIT- NST  SUBJECTIVE:  Sari Cogan is a 36 y.o. G38P2002 female at [redacted]w[redacted]d, here for a NST for pregnancy complicated by [facility].  She reports active fetal movement, contractions: none, vaginal bleeding: none, membranes: intact.   OBJECTIVE:  BP 121/77   Pulse 68   Wt 191 lb (86.6 kg)   LMP 08/29/2021   BMI 31.78 kg/m   Appears well, no apparent distress  Results for orders placed or performed in visit on 06/07/22 (from the past 24 hour(s))  POC Urinalysis Dipstick OB   Collection Time: 06/07/22  9:54 AM  Result Value Ref Range   Color, UA     Clarity, UA     Glucose, UA Negative Negative   Bilirubin, UA     Ketones, UA small    Spec Grav, UA     Blood, UA neg    pH, UA     POC,PROTEIN,UA Negative Negative, Trace, Small (1+), Moderate (2+), Large (3+), 4+   Urobilinogen, UA     Nitrite, UA neg    Leukocytes, UA Negative Negative   Appearance     Odor      NST: FHR baseline 125 bpm, Variability: moderate, Accelerations:present, Decelerations:  Absent= Cat 1/reactive Toco: none   ASSESSMENT: G3P2002 at [redacted]w[redacted]d with CHTN NST reactive  PLAN: EFM strip reviewed by Dr. Despina Hidden   Recommendations: keep next appointment as scheduled    Jobe Marker  06/07/2022 11:32 AM

## 2022-06-08 ENCOUNTER — Encounter (HOSPITAL_COMMUNITY): Payer: Self-pay

## 2022-06-08 ENCOUNTER — Telehealth (HOSPITAL_COMMUNITY): Payer: Self-pay | Admitting: *Deleted

## 2022-06-09 ENCOUNTER — Other Ambulatory Visit: Payer: Self-pay | Admitting: Obstetrics & Gynecology

## 2022-06-09 ENCOUNTER — Encounter (HOSPITAL_COMMUNITY): Payer: Self-pay | Admitting: *Deleted

## 2022-06-09 ENCOUNTER — Telehealth (HOSPITAL_COMMUNITY): Payer: Self-pay | Admitting: *Deleted

## 2022-06-09 ENCOUNTER — Other Ambulatory Visit: Payer: Self-pay | Admitting: Advanced Practice Midwife

## 2022-06-09 DIAGNOSIS — O10919 Unspecified pre-existing hypertension complicating pregnancy, unspecified trimester: Secondary | ICD-10-CM

## 2022-06-09 NOTE — Telephone Encounter (Signed)
Preadmission screen  

## 2022-06-10 ENCOUNTER — Encounter: Payer: Self-pay | Admitting: Obstetrics & Gynecology

## 2022-06-10 ENCOUNTER — Ambulatory Visit (INDEPENDENT_AMBULATORY_CARE_PROVIDER_SITE_OTHER): Payer: Medicaid Other | Admitting: Obstetrics & Gynecology

## 2022-06-10 ENCOUNTER — Ambulatory Visit (INDEPENDENT_AMBULATORY_CARE_PROVIDER_SITE_OTHER): Payer: Medicaid Other

## 2022-06-10 VITALS — BP 130/81 | HR 80 | Wt 192.0 lb

## 2022-06-10 DIAGNOSIS — O10919 Unspecified pre-existing hypertension complicating pregnancy, unspecified trimester: Secondary | ICD-10-CM

## 2022-06-10 DIAGNOSIS — O09523 Supervision of elderly multigravida, third trimester: Secondary | ICD-10-CM

## 2022-06-10 DIAGNOSIS — Z3A38 38 weeks gestation of pregnancy: Secondary | ICD-10-CM

## 2022-06-10 DIAGNOSIS — I10 Essential (primary) hypertension: Secondary | ICD-10-CM

## 2022-06-10 DIAGNOSIS — O0993 Supervision of high risk pregnancy, unspecified, third trimester: Secondary | ICD-10-CM

## 2022-06-10 LAB — POCT URINALYSIS DIPSTICK OB
Blood, UA: NEGATIVE
Glucose, UA: NEGATIVE
Ketones, UA: NEGATIVE
Leukocytes, UA: NEGATIVE
Nitrite, UA: NEGATIVE
POC,PROTEIN,UA: NEGATIVE

## 2022-06-10 NOTE — Progress Notes (Signed)
HIGH-RISK PREGNANCY VISIT Patient name: Kayla Fernandez MRN [id number]  Date of birth: 1986/03/24 Chief Complaint:   Routine Prenatal Visit (BPP)  History of Present Illness:   Kayla Fernandez is a 36 y.o. G39P2002 female at [redacted]w[redacted]d with an Estimated Date of Delivery: 06/18/22 being seen today for ongoing management of a high-risk pregnancy complicated by: -chronic HTN: on Labetalol 200mg  bid   Today she reports occasional contractions.   Contractions: Irritability. Vag. Bleeding: None.  Movement: Present. denies leaking of fluid.      03/18/2022   10:41 AM 01/22/2022   10:35 AM 07/19/2019    9:50 AM 04/12/2018    1:52 PM 03/24/2017    8:48 AM  Depression screen PHQ 2/9  Decreased Interest 0 0 0 0 0  Down, Depressed, Hopeless 0 0 0 0 0  PHQ - 2 Score 0 0 0 0 0  Altered sleeping 0 0     Tired, decreased energy 0 0     Change in appetite 0 0     Feeling bad or failure about yourself  0 0     Trouble concentrating 0 0     Moving slowly or fidgety/restless 0 0     Suicidal thoughts 0 0     PHQ-9 Score 0 0        Current Outpatient Medications  Medication Instructions   aspirin EC 162 mg, Oral, Daily   Blood Pressure Monitoring (BLOOD PRESSURE MONITOR AUTOMAT) DEVI Take BP at home daily.  Alert 4/9 if >140/90 more than once.   clindamycin (CLEOCIN) 300 mg, Oral, 3 times daily   labetalol (NORMODYNE) 200 mg, Oral, 2 times daily   Prenatal Vit-Iron Carbonyl-FA (PRENATABS RX) 29-1 MG TABS 1 tablet, Oral, Daily     Review of Systems:   Pertinent items are noted in HPI Denies abnormal vaginal discharge w/ itching/odor/irritation, headaches, visual changes, shortness of breath, chest pain, abdominal pain, severe nausea/vomiting, or problems with urination or bowel movements unless otherwise stated above. Pertinent History Reviewed:  Reviewed past medical,surgical, social, obstetrical and family history.  Reviewed problem list, medications and allergies. Physical Assessment:   Vitals:    06/10/22 1109  BP: 130/81  Pulse: 80  Weight: 192 lb (87.1 kg)  Body mass index is 31.95 kg/m.           Physical Examination:   General appearance: alert, well appearing, and in no distress  Mental status: normal mood, behavior, speech, dress, motor activity, and thought processes  Skin: warm & dry   Extremities: Edema: None    Cardiovascular: normal heart rate noted  Respiratory: normal respiratory effort, no distress  Abdomen: gravid, soft, non-tender  Pelvic: Cervical exam performed  Dilation: 1.5 Effacement (%): 20 Station: -3  Fetal Status:     Movement: Present Presentation: Vertex  Fetal Surveillance Testing today: 06/12/22 38+6 wks,cephalic,posterior placenta gr 3,BPP 8/8,AFI 8 cm,RI .56..57..61=65%,EFW 3259 g 37%   Chaperone:  pt declined     Results for orders placed or performed in visit on 06/10/22 (from the past 24 hour(s))  POC Urinalysis Dipstick OB   Collection Time: 06/10/22 11:12 AM  Result Value Ref Range   Color, UA     Clarity, UA     Glucose, UA Negative Negative   Bilirubin, UA     Ketones, UA neg    Spec Grav, UA     Blood, UA neg    pH, UA     POC,PROTEIN,UA Negative Negative, Trace, Small (1+), Moderate (2+),  Large (3+), 4+   Urobilinogen, UA     Nitrite, UA neg    Leukocytes, UA Negative Negative   Appearance     Odor       Assessment & Plan:  High-risk pregnancy: G3P2002 at [redacted]w[redacted]d with an Estimated Date of Delivery: 06/18/22   1) cHTN -BP stable with current medication -IOL scheduled for tomorrow   Meds: No orders of the defined types were placed in this encounter.   Labs/procedures today: BPP  Treatment Plan:  as outlined above  Reviewed: Term labor symptoms and general obstetric precautions including but not limited to vaginal bleeding, contractions, leaking of fluid and fetal movement were reviewed in detail with the patient.  All questions were answered. Pt has home bp cuff. Check bp weekly, let us know if >140/90.   Follow-up:  Return in about 1 week (around 06/17/2022) for 1wk RN visit for BP check then 5wk postparutm visit.   Future Appointments  Date Time Provider Department Center  06/11/2022  7:15 AM [facility] SCHED ROOM [facility] None  06/17/2022 10:10 AM [facility] NURSE [facility]  07/14/2022 10:10 AM Arabella Merles, CNM [facility]    Orders Placed This Encounter  Procedures   POC Urinalysis Dipstick OB    Myna Hidalgo, DO Attending Obstetrician & Gynecologist, [facility] for [employer], [facility]

## 2022-06-10 NOTE — Progress Notes (Signed)
[place] 38+6 wks,cephalic,posterior placenta gr 3,BPP 8/8,AFI 8 cm,RI .56..57..61=65%,EFW 3259 g 37%

## 2022-06-11 ENCOUNTER — Inpatient Hospital Stay (HOSPITAL_COMMUNITY): Payer: Medicaid Other | Admitting: Anesthesiology

## 2022-06-11 ENCOUNTER — Inpatient Hospital Stay (HOSPITAL_COMMUNITY): Payer: Medicaid Other

## 2022-06-11 ENCOUNTER — Inpatient Hospital Stay (HOSPITAL_COMMUNITY)
Admission: AD | Admit: 2022-06-11 | Discharge: 2022-06-12 | DRG: 807 | Disposition: A | Discharge status: Home / Self Care | Payer: Medicaid Other | Attending: Obstetrics and Gynecology | Admitting: Obstetrics and Gynecology

## 2022-06-11 ENCOUNTER — Encounter (HOSPITAL_COMMUNITY): Payer: Self-pay | Admitting: Obstetrics & Gynecology

## 2022-06-11 DIAGNOSIS — Z7982 Long term (current) use of aspirin: Secondary | ICD-10-CM | POA: Diagnosis not present

## 2022-06-11 DIAGNOSIS — O1002 Pre-existing essential hypertension complicating childbirth: Secondary | ICD-10-CM | POA: Diagnosis present

## 2022-06-11 DIAGNOSIS — Z3A39 39 weeks gestation of pregnancy: Secondary | ICD-10-CM

## 2022-06-11 DIAGNOSIS — O09529 Supervision of elderly multigravida, unspecified trimester: Secondary | ICD-10-CM

## 2022-06-11 DIAGNOSIS — O10919 Unspecified pre-existing hypertension complicating pregnancy, unspecified trimester: Secondary | ICD-10-CM | POA: Diagnosis present

## 2022-06-11 LAB — PROTEIN / CREATININE RATIO, URINE
Creatinine, Urine: 77.19 mg/dL
Protein Creatinine Ratio: 0.12 mg/mg{Cre} (ref 0.00–0.15)
Total Protein, Urine: 9 mg/dL

## 2022-06-11 LAB — COMPREHENSIVE METABOLIC PANEL
ALT: 15 U/L (ref 0–44)
AST: 20 U/L (ref 15–41)
Albumin: 2.6 g/dL — ABNORMAL LOW (ref 3.5–5.0)
Alkaline Phosphatase: 136 U/L — ABNORMAL HIGH (ref 38–126)
Anion gap: 11 (ref 5–15)
BUN: 8 mg/dL (ref 6–20)
CO2: 19 mmol/L — ABNORMAL LOW (ref 22–32)
Calcium: 8.5 mg/dL — ABNORMAL LOW (ref 8.9–10.3)
Chloride: 108 mmol/L (ref 98–111)
Creatinine, Ser: 0.71 mg/dL (ref 0.44–1.00)
GFR, Estimated: >60 mL/min (ref >60)
Glucose, Bld: 84 mg/dL (ref 70–99)
Potassium: 3.4 mmol/L — ABNORMAL LOW (ref 3.5–5.1)
Sodium: 138 mmol/L (ref 135–145)
Total Bilirubin: 0.8 mg/dL (ref 0.3–1.2)
Total Protein: 6.1 g/dL — ABNORMAL LOW (ref 6.5–8.1)

## 2022-06-11 LAB — CBC WITH DIFFERENTIAL/PLATELET
Abs Immature Granulocytes: 0.06 10*3/uL (ref 0.00–0.07)
Basophils Absolute: 0 10*3/uL (ref 0.0–0.1)
Basophils Relative: 0 %
Eosinophils Absolute: 0 10*3/uL (ref 0.0–0.5)
Eosinophils Relative: 0 %
HCT: 33.9 % — ABNORMAL LOW (ref 36.0–46.0)
Hemoglobin: 11.4 g/dL — ABNORMAL LOW (ref 12.0–15.0)
Immature Granulocytes: 1 %
Lymphocytes Relative: 10 %
Lymphs Abs: 1.3 10*3/uL (ref 0.7–4.0)
MCH: 32.1 pg (ref 26.0–34.0)
MCHC: 33.6 g/dL (ref 30.0–36.0)
MCV: 95.5 fL (ref 80.0–100.0)
Monocytes Absolute: 0.8 10*3/uL (ref 0.1–1.0)
Monocytes Relative: 6 %
Neutro Abs: 10.7 10*3/uL — ABNORMAL HIGH (ref 1.7–7.7)
Neutrophils Relative %: 83 %
Platelets: 234 10*3/uL (ref 150–400)
RBC: 3.55 MIL/uL — ABNORMAL LOW (ref 3.87–5.11)
RDW: 14.6 % (ref 11.5–15.5)
WBC: 12.8 10*3/uL — ABNORMAL HIGH (ref 4.0–10.5)
nRBC: 0 % (ref 0.0–0.2)

## 2022-06-11 LAB — RPR: RPR Ser Ql: NONREACTIVE

## 2022-06-11 LAB — CBC
HCT: 32.4 % — ABNORMAL LOW (ref 36.0–46.0)
Hemoglobin: 10.8 g/dL — ABNORMAL LOW (ref 12.0–15.0)
MCH: 31.5 pg (ref 26.0–34.0)
MCHC: 33.3 g/dL (ref 30.0–36.0)
MCV: 94.5 fL (ref 80.0–100.0)
Platelets: 262 10*3/uL (ref 150–400)
RBC: 3.43 MIL/uL — ABNORMAL LOW (ref 3.87–5.11)
RDW: 14.6 % (ref 11.5–15.5)
WBC: 7.6 10*3/uL (ref 4.0–10.5)
nRBC: 0 % (ref 0.0–0.2)

## 2022-06-11 LAB — TYPE AND SCREEN
ABO/RH(D): A POS
Antibody Screen: NEGATIVE

## 2022-06-11 MED ORDER — PHENYLEPHRINE 80 MCG/ML (10ML) SYRINGE FOR IV PUSH (FOR BLOOD PRESSURE SUPPORT): 80.0000 ug | PREFILLED_SYRINGE | INTRAVENOUS | Status: DC | PRN

## 2022-06-11 MED ORDER — TERBUTALINE SULFATE 1 MG/ML IJ SOLN: 0.2500 mg | Freq: Once | INTRAMUSCULAR | Status: DC | PRN

## 2022-06-11 MED ORDER — ACETAMINOPHEN 325 MG PO TABS: 650.0000 mg | ORAL_TABLET | ORAL | Status: DC | PRN

## 2022-06-11 MED ORDER — NIFEDIPINE ER OSMOTIC RELEASE 30 MG PO TB24
30.0000 mg | ORAL_TABLET | Freq: Every day | ORAL | Status: DC
  Administered 2022-06-12: 30 mg via ORAL
  Filled 2022-06-11: qty 1

## 2022-06-11 MED ORDER — SIMETHICONE 80 MG PO CHEW: 80.0000 mg | CHEWABLE_TABLET | ORAL | Status: DC | PRN

## 2022-06-11 MED ORDER — DIPHENHYDRAMINE HCL 25 MG PO CAPS: 25.0000 mg | ORAL_CAPSULE | Freq: Four times a day (QID) | ORAL | Status: DC | PRN

## 2022-06-11 MED ORDER — LACTATED RINGERS IV SOLN: INTRAVENOUS | Status: DC

## 2022-06-11 MED ORDER — OXYTOCIN-SODIUM CHLORIDE 30-0.9 UT/500ML-% IV SOLN
2.5000 [IU]/h | INTRAVENOUS | Status: DC
  Filled 2022-06-11: qty 500

## 2022-06-11 MED ORDER — SENNOSIDES-DOCUSATE SODIUM 8.6-50 MG PO TABS
2.0000 | ORAL_TABLET | Freq: Every day | ORAL | Status: DC
  Administered 2022-06-12: 2 via ORAL
  Filled 2022-06-11: qty 2

## 2022-06-11 MED ORDER — OXYCODONE-ACETAMINOPHEN 5-325 MG PO TABS: 1.0000 | ORAL_TABLET | ORAL | Status: DC | PRN

## 2022-06-11 MED ORDER — DIBUCAINE (PERIANAL) 1 % EX OINT: 1.0000 | TOPICAL_OINTMENT | CUTANEOUS | Status: DC | PRN

## 2022-06-11 MED ORDER — FENTANYL CITRATE (PF) 100 MCG/2ML IJ SOLN
100.0000 ug | INTRAMUSCULAR | Status: DC | PRN
  Administered 2022-06-11 (×2): 100 ug via INTRAVENOUS
  Filled 2022-06-11 (×2): qty 2

## 2022-06-11 MED ORDER — WITCH HAZEL-GLYCERIN EX PADS: 1.0000 | MEDICATED_PAD | CUTANEOUS | Status: DC | PRN

## 2022-06-11 MED ORDER — LACTATED RINGERS IV SOLN: 500.0000 mL | INTRAVENOUS | Status: DC | PRN

## 2022-06-11 MED ORDER — SOD CITRATE-CITRIC ACID 500-334 MG/5ML PO SOLN: 30.0000 mL | ORAL | Status: DC | PRN

## 2022-06-11 MED ORDER — MISOPROSTOL 50MCG HALF TABLET
50.0000 ug | ORAL_TABLET | ORAL | Status: DC | PRN
  Administered 2022-06-11 (×2): 50 ug via BUCCAL
  Filled 2022-06-11 (×2): qty 1

## 2022-06-11 MED ORDER — PRENATAL MULTIVITAMIN CH
1.0000 | ORAL_TABLET | Freq: Every day | ORAL | Status: DC
  Administered 2022-06-12: 1 via ORAL
  Filled 2022-06-11: qty 1

## 2022-06-11 MED ORDER — EPHEDRINE 5 MG/ML INJ: 10.0000 mg | INTRAVENOUS | Status: DC | PRN

## 2022-06-11 MED ORDER — ONDANSETRON HCL 4 MG/2ML IJ SOLN: 4.0000 mg | INTRAMUSCULAR | Status: DC | PRN

## 2022-06-11 MED ORDER — MEDROXYPROGESTERONE ACETATE 150 MG/ML IM SUSP: 150.0000 mg | INTRAMUSCULAR | Status: DC | PRN

## 2022-06-11 MED ORDER — LIDOCAINE HCL (PF) 1 % IJ SOLN
INTRAMUSCULAR | Status: DC | PRN
  Administered 2022-06-11: 6 mL via EPIDURAL

## 2022-06-11 MED ORDER — MISOPROSTOL 25 MCG QUARTER TABLET: 25.0000 ug | ORAL_TABLET | ORAL | Status: DC | PRN

## 2022-06-11 MED ORDER — BENZOCAINE-MENTHOL 20-0.5 % EX AERO: 1.0000 | INHALATION_SPRAY | CUTANEOUS | Status: DC | PRN

## 2022-06-11 MED ORDER — ONDANSETRON HCL 4 MG PO TABS: 4.0000 mg | ORAL_TABLET | ORAL | Status: DC | PRN

## 2022-06-11 MED ORDER — DIPHENHYDRAMINE HCL 50 MG/ML IJ SOLN: 12.5000 mg | INTRAMUSCULAR | Status: DC | PRN

## 2022-06-11 MED ORDER — FENTANYL-BUPIVACAINE-NACL 0.5-0.125-0.9 MG/250ML-% EP SOLN
12.0000 mL/h | EPIDURAL | Status: DC | PRN
  Administered 2022-06-11: 12 mL/h via EPIDURAL
  Filled 2022-06-11: qty 250

## 2022-06-11 MED ORDER — FUROSEMIDE 20 MG PO TABS
20.0000 mg | ORAL_TABLET | Freq: Every day | ORAL | Status: DC
  Administered 2022-06-12: 20 mg via ORAL
  Filled 2022-06-11: qty 1

## 2022-06-11 MED ORDER — LIDOCAINE HCL (PF) 1 % IJ SOLN: 30.0000 mL | INTRAMUSCULAR | Status: DC | PRN

## 2022-06-11 MED ORDER — LIDOCAINE-EPINEPHRINE (PF) 1.5 %-1:200000 IJ SOLN
INTRAMUSCULAR | Status: DC | PRN
  Administered 2022-06-11: 3 mL via PERINEURAL

## 2022-06-11 MED ORDER — MEASLES, MUMPS & RUBELLA VAC IJ SOLR: 0.5000 mL | Freq: Once | INTRAMUSCULAR | Status: DC

## 2022-06-11 MED ORDER — LACTATED RINGERS IV SOLN
500.0000 mL | Freq: Once | INTRAVENOUS | Status: AC
  Administered 2022-06-11: 500 mL via INTRAVENOUS

## 2022-06-11 MED ORDER — OXYTOCIN BOLUS FROM INFUSION
333.0000 mL | Freq: Once | INTRAVENOUS | Status: AC
  Administered 2022-06-11: 333 mL via INTRAVENOUS

## 2022-06-11 MED ORDER — IBUPROFEN 600 MG PO TABS
600.0000 mg | ORAL_TABLET | Freq: Four times a day (QID) | ORAL | Status: DC
  Administered 2022-06-11 – 2022-06-12 (×3): 600 mg via ORAL
  Filled 2022-06-11 (×3): qty 1

## 2022-06-11 MED ORDER — ONDANSETRON HCL 4 MG/2ML IJ SOLN
4.0000 mg | Freq: Four times a day (QID) | INTRAMUSCULAR | Status: DC | PRN
  Administered 2022-06-11: 4 mg via INTRAVENOUS
  Filled 2022-06-11: qty 2

## 2022-06-11 MED ORDER — OXYCODONE-ACETAMINOPHEN 5-325 MG PO TABS: 2.0000 | ORAL_TABLET | ORAL | Status: DC | PRN

## 2022-06-11 MED ORDER — TETANUS-DIPHTH-ACELL PERTUSSIS 5-2.5-18.5 LF-MCG/0.5 IM SUSY: 0.5000 mL | PREFILLED_SYRINGE | Freq: Once | INTRAMUSCULAR | Status: DC

## 2022-06-11 MED ORDER — COCONUT OIL OIL: 1.0000 | TOPICAL_OIL | Status: DC | PRN

## 2022-06-11 NOTE — Anesthesia Preprocedure Evaluation (Signed)
Anesthesia Evaluation  Patient identified by MRN, date of birth, ID band Patient awake    Reviewed: Allergy & Precautions, H&P , NPO status , Patient's Chart, lab work & pertinent test results  History of Anesthesia Complications Negative for: history of anesthetic complications  Airway Mallampati: II  TM Distance: >3 FB     Dental   Pulmonary neg pulmonary ROS,    Pulmonary exam normal        Cardiovascular hypertension,  Rhythm:regular Rate:Normal     Neuro/Psych negative neurological ROS  negative psych ROS   GI/Hepatic negative GI ROS, Neg liver ROS,   Endo/Other  negative endocrine ROS  Renal/GU negative Renal ROS  negative genitourinary   Musculoskeletal   Abdominal   Peds  Hematology negative hematology ROS (+)   Anesthesia Other Findings   Reproductive/Obstetrics (+) Pregnancy                             Anesthesia Physical Anesthesia Plan  ASA: 2  Anesthesia Plan: Epidural   Post-op Pain Management:    Induction:   PONV Risk Score and Plan:   Airway Management Planned:   Additional Equipment:   Intra-op Plan:   Post-operative Plan:   Informed Consent: I have reviewed the patients History and Physical, chart, labs and discussed the procedure including the risks, benefits and alternatives for the proposed anesthesia with the patient or authorized representative who has indicated his/her understanding and acceptance.       Plan Discussed with:   Anesthesia Plan Comments:         Anesthesia Quick Evaluation  

## 2022-06-11 NOTE — Lactation Note (Signed)
This note was copied from a baby's chart. Lactation Consultation Note Mom's choice of feeding is formula.  Patient Name: Kayla Fernandez [id number] Date: 06/11/2022   Age:36 hours  Maternal Data    Feeding Mother's Current Feeding Choice: Formula  LATCH Score                    Lactation Tools Discussed/Used    Interventions    Discharge    Consult Status Consult Status: Complete    Malinda Mayden, Diamond Nickel 06/11/2022, 8:04 PM

## 2022-06-11 NOTE — Progress Notes (Incomplete)
Post Partum Day 1  Subjective: Doing well. No acute events overnight. Pain is controlled and bleeding is appropriate. She is eating, drinking, voiding, and ambulating without issue. She is *** feeding which is going ***. She has no other concerns at this time.  Objective: Blood pressure 128/74, pulse 68, temperature 97.8 F (36.6 C), temperature source Oral, resp. rate 18, height 5\' 6"  (1.676 m), weight 87.5 kg, last menstrual period 08/29/2021, SpO2 99 %, unknown if currently breastfeeding.  Physical Exam:  General: {Exam; general:16600} Lochia: {Desc; appropriate/inappropriate:30686} Uterine Fundus: {Desc; firm/soft:30687} DVT Evaluation: {Exam; dvt:13533}  Recent Labs    06/11/22 0812 06/11/22 2038  HGB 10.8* 11.4*  HCT 32.4* 33.9*   Assessment/Plan: Kayla Fernandez is a 36 y.o. G3P3003 on PPD# 1 s/p SVD.  Progressing well. Meeting postpartum milestones. VSS. Continue routine postpartum care.  #cHTN: - Transitioned to Procardia 30 mg postpartum - BP *** - Lasix ordered, plan for 5 day course - Will continue to monitor and adjust regimen as needed   Feeding: Formula  Contraception: OCPs  Dispo: Plan for discharge on PPD#2.   LOS: 0 days   31, MD  06/11/2022, 9:45 PM

## 2022-06-11 NOTE — Discharge Summary (Signed)
Postpartum Discharge Summary  Date of Service updated***     Patient Name: Kayla Fernandez DOB: 07-25-1986 MRN: [id number]  Date of admission: 06/11/2022 Delivery date:06/11/2022  Delivering provider: Gaylan Gerold R  Date of discharge: 06/11/2022  Admitting diagnosis: Chronic hypertension affecting pregnancy [O10.919] Intrauterine pregnancy: [redacted]w[redacted]d    Secondary diagnosis:  Principal Problem:   Chronic hypertension affecting pregnancy  Additional problems: ***    Discharge diagnosis: Term Pregnancy Delivered and CHTN                                              Post partum procedures:{Postpartum procedures:23558} Augmentation: Cytotec Complications: None  Hospital course: Induction of Labor With Vaginal Delivery   36y.o. yo G3P3003 at 313w0das admitted to the hospital 06/11/2022 for induction of labor.  Indication for induction:  cHTN .  Patient had an uncomplicated labor course as follows: Membrane Rupture Time/Date: 5:28 PM ,06/11/2022   Delivery Method:Vaginal, Spontaneous  Episiotomy: None  Lacerations:  Periurethral  Details of delivery can be found in separate delivery note.  Patient had a routine postpartum course. Patient is discharged home 06/11/22.  Newborn Data: Birth date:06/11/2022  Birth time:7:19 PM  Gender:Female  Living status:Living  Apgars:8 ,9  Weight:6 lb 1.7 oz (2.77 kg)   Magnesium Sulfate received: No BMZ received: No Rhophylac:N/A MMR:N/A T-DaP:Given prenatally Flu: N/A Transfusion:No  Physical exam  Vitals:   06/11/22 2000 06/11/22 2015 06/11/22 2030 06/11/22 2045  BP: 126/60 135/61 133/73 125/66  Pulse: 78 75 66 67  Resp:      Temp: (!) 97.4 F (36.3 C)   (!) 97.3 F (36.3 C)  TempSrc: Oral   Oral  SpO2: 100% 100% 100%   Weight:      Height:       General: {Exam; general:21111117} Lochia: {Desc; appropriate/inappropriate:30686::"appropriate"} Uterine Fundus: {Desc; firm/soft:30687} Incision: {Exam; incision:21111123} DVT  Evaluation: {Exam; dvWUJ:8119147}abs: Lab Results  Component Value Date   WBC 12.8 (H) 06/11/2022   HGB 11.4 (L) 06/11/2022   HCT 33.9 (L) 06/11/2022   MCV 95.5 06/11/2022   PLT 234 06/11/2022      Latest Ref Rng & Units 06/11/2022    8:12 AM  CMP  Glucose 70 - 99 mg/dL 84   BUN 6 - 20 mg/dL 8   Creatinine 0.44 - 1.00 mg/dL 0.71   Sodium 135 - 145 mmol/L 138   Potassium 3.5 - 5.1 mmol/L 3.4   Chloride 98 - 111 mmol/L 108   CO2 22 - 32 mmol/L 19   Calcium 8.9 - 10.3 mg/dL 8.5   Total Protein 6.5 - 8.1 g/dL 6.1   Total Bilirubin 0.3 - 1.2 mg/dL 0.8   Alkaline Phos 38 - 126 U/L 136   AST 15 - 41 U/L 20   ALT 0 - 44 U/L 15    Edinburgh Score:     No data to display           After visit meds:  Allergies as of 06/11/2022       Reactions   Penicillins Rash     Med Rec must be completed [facility]      Discharge home in stable condition Infant Feeding: {Baby feeding:23562} Infant Disposition:{CHL IP OB HOME WITH MOWGNFAO:13086}ischarge instruction: per After Visit Summary and Postpartum booklet. Activity: Advance as tolerated. Pelvic rest  for 6 weeks.  Diet: {OB FVCB:44967591}  Future Appointments: Future Appointments  Date Time Provider [place]  06/17/2022 10:10 AM [facility] NURSE [facility]  07/14/2022 10:10 AM Myrtis Ser, CNM [facility]   Follow up Visit: (No message sent, PP visit and BP check already scheduled) Please schedule this patient for a In person postpartum visit in 4 weeks with the following provider: APP. Additional Postpartum F/U:BP check 1 week  High risk pregnancy complicated by: HTN Delivery mode:  Vaginal, Spontaneous  Anticipated Birth Control:  POPs   06/11/2022 Gabriel Carina, CNM

## 2022-06-11 NOTE — Anesthesia Procedure Notes (Signed)
Epidural Patient location during procedure: OB Start time: 06/11/2022 6:30 PM End time: 06/11/2022 6:41 PM  Staffing Anesthesiologist: Lucretia Kern, MD Performed: anesthesiologist   Preanesthetic Checklist Completed: patient identified, IV checked, risks and benefits discussed, monitors and equipment checked, pre-op evaluation and timeout performed  Epidural Patient position: sitting Prep: DuraPrep Patient monitoring: heart rate, continuous pulse ox and blood pressure Approach: midline Location: L3-L4 Injection technique: LOR air  Needle:  Needle type: Tuohy  Needle gauge: 17 G Needle length: 9 cm Needle insertion depth: 7 cm Catheter type: closed end flexible Catheter size: 19 Gauge Catheter at skin depth: 12 cm Test dose: negative  Assessment Events: blood not aspirated, injection not painful, no injection resistance, no paresthesia and negative IV test  Additional Notes Reason for block:procedure for pain

## 2022-06-11 NOTE — H&P (Signed)
OBSTETRIC ADMISSION HISTORY AND PHYSICAL  Shekita Boyden is a 36 y.o. female G3P2002 with IUP at [redacted]w[redacted]d by 16wk U/S presenting for IOL for cHTN (on 200mg  labetalol BID). She reports +FMs, No LOF, no VB, no blurry vision, headaches or peripheral edema, and RUQ pain.  She plans on formula feeding. She request POPs for birth control. She received her prenatal care at [facility]  Dating: By 16wk U/S --->  Estimated Date of Delivery: 06/18/22 Sono:  @[redacted]w[redacted]d , CWD, normal anatomy, cephalic presentation, 3259g, 08/19/22 EFW  Prenatal History/Complications: Chronic hypertension (200mg  labetalol BID)  Past Medical History: Past Medical History:  Diagnosis Date   Chronic hypertension 12/24/2021   Past Surgical History: Past Surgical History:  Procedure Laterality Date   NO PAST SURGERIES     Obstetrical History: OB History     Gravida  3   Para  2   Term  2   Preterm      AB      Living  2      SAB      IAB      Ectopic      Multiple      Live Births  2          Social History Social History   Socioeconomic History   Marital status: Single    Spouse name: Not on file   Number of children: Not on file   Years of education: Not on file   Highest education level: Not on file  Occupational History   Not on file  Tobacco Use   Smoking status: Never   Smokeless tobacco: Never  Vaping Use   Vaping Use: Never used  Substance and Sexual Activity   Alcohol use: No   Drug use: No   Sexual activity: Yes    Birth control/protection: None  Other Topics Concern   Not on file  Social History Narrative   Not on file   Social Determinants of Health   Financial Resource Strain: Low Risk  (03/18/2022)   Overall Financial Resource Strain (CARDIA)    Difficulty of Paying Living Expenses: Not hard at all  Food Insecurity: No Food Insecurity (03/18/2022)   Hunger Vital Sign    Worried About Running Out of Food in the Last Year: Never true    Ran Out of Food in the Last Year:  Never true  Transportation Needs: No Transportation Needs (03/18/2022)   PRAPARE - 05/18/2022 (Medical): No    Lack of Transportation (Non-Medical): No  Physical Activity: Sufficiently Active (03/18/2022)   Exercise Vital Sign    Days of Exercise per Week: 7 days    Minutes of Exercise per Session: 30 min  Stress: No Stress Concern Present (03/18/2022)   Administrator, [employer] of Occupational Health - Occupational Stress Questionnaire    Feeling of Stress : Not at all  Social Connections: Moderately Integrated (03/18/2022)   Social Connection and Isolation Panel [NHANES]    Frequency of Communication with Friends and Family: More than three times a week    Frequency of Social Gatherings with Friends and Family: More than three times a week    Attends Religious Services: 1 to 4 times per year    Active Member of 05/18/2022 or Organizations: No    Attends [employer] Meetings: Never    Marital Status: Living with partner  Recent Concern: Social Connections - Moderately Isolated (01/22/2022)   Social Connection and Isolation Panel [NHANES]  Frequency of Communication with Friends and Family: More than three times a week    Frequency of Social Gatherings with Friends and Family: More than three times a week    Attends Religious Services: 1 to 4 times per year    Active Member of [employer] or Organizations: No    Attends Banker Meetings: Never    Marital Status: Never married   Family History: Family History  Problem Relation Age of Onset   Transient ischemic attack Maternal Grandmother    Cancer Paternal Grandmother    Stroke Other    Allergies: Allergies  Allergen Reactions   Penicillins Rash   Medications Prior to Admission  Medication Sig Dispense Refill Last Dose   aspirin EC 81 MG tablet Take 2 tablets (162 mg total) by mouth daily. 60 tablet 6 06/10/2022   labetalol (NORMODYNE) 200 MG tablet Take 1 tablet (200 mg total) by mouth 2 (two)  times daily. 60 tablet 3 06/11/2022 at 0600   Prenatal Vit-Iron Carbonyl-FA (PRENATABS RX) 29-1 MG TABS Take 1 tablet by mouth daily. 90 tablet 4 06/11/2022   Blood Pressure Monitoring (BLOOD PRESSURE MONITOR AUTOMAT) DEVI Take BP at home daily.  Alert [place] if >140/90 more than once. 1 each 0    clindamycin (CLEOCIN) 300 MG capsule Take 1 capsule (300 mg total) by mouth 3 (three) times daily. (Patient not taking: Reported on 05/31/2022) 30 capsule 0    Review of Systems  All systems reviewed and negative except as stated in HPI  Blood pressure 119/73, pulse 76, temperature 97.6 F (36.4 C), temperature source Axillary, resp. rate 18, height 5\' 6"  (1.676 m), weight 192 lb 12.8 oz (87.5 kg), last menstrual period 08/29/2021. General appearance: alert, cooperative, appears stated age, and no distress Lungs: clear to auscultation bilaterally Heart: regular rate and rhythm Abdomen: soft, non-tender; bowel sounds normal Pelvic: normal external female genitalia Extremities: Homans sign is negative, no sign of DVT DTR's normal Presentation: cephalic Fetal monitoring: Baseline: 125 bpm, Variability: Good {> 6 bpm), Accelerations: Reactive, and Decelerations: Absent Uterine activity: None Dilation: 1.5 Exam by:: [email or web address], CNM  Prenatal labs: ABO, Rh: --/--/A POS (06/30 09-16-1984) Antibody: NEG (06/30 09-16-1984) Rubella: 4.01 (02/10 1143) RPR: Non Reactive (04/06 0818)  HBsAg: Negative (02/10 1143)  HIV: Non Reactive (04/06 0818)  GBS: --06-[birth year]-1976 (06/09 1400)  2hr Glucola: 74/124/106 Genetic screening: normal Anatomy U/S: normal  Prenatal Transfer Tool  Maternal Diabetes: No Genetic Screening: Normal Maternal Ultrasounds/Referrals: Normal Fetal Ultrasounds or other Referrals:  Referred to Materal Fetal Medicine  Maternal Substance Abuse:  No Significant Maternal Medications:  None Significant Maternal Lab Results: Group B Strep negative  Results for orders placed or performed during the hospital  encounter of 06/11/22 (from the past 24 hour(s))  CBC   Collection Time: 06/11/22  8:12 AM  Result Value Ref Range   WBC 7.6 4.0 - 10.5 K/uL   RBC 3.43 (L) 3.87 - 5.11 MIL/uL   Hemoglobin 10.8 (L) 12.0 - 15.0 g/dL   HCT 06/13/22 (L) 54.0 - 98.1 %   MCV 94.5 80.0 - 100.0 fL   MCH 31.5 26.0 - 34.0 pg   MCHC 33.3 30.0 - 36.0 g/dL   RDW 19.1 47.8 - 29.5 %   Platelets 262 150 - 400 K/uL   nRBC 0.0 0.0 - 0.2 %  Comprehensive metabolic panel   Collection Time: 06/11/22  8:12 AM  Result Value Ref Range   Sodium 138 135 - 145 mmol/L   Potassium 3.4 (  L) 3.5 - 5.1 mmol/L   Chloride 108 98 - 111 mmol/L   CO2 19 (L) 22 - 32 mmol/L   Glucose, Bld 84 70 - 99 mg/dL   BUN 8 6 - 20 mg/dL   Creatinine, Ser 0.71 0.44 - 1.00 mg/dL   Calcium 8.5 (L) 8.9 - 10.3 mg/dL   Total Protein 6.1 (L) 6.5 - 8.1 g/dL   Albumin 2.6 (L) 3.5 - 5.0 g/dL   AST 20 15 - 41 U/L   ALT 15 0 - 44 U/L   Alkaline Phosphatase 136 (H) 38 - 126 U/L   Total Bilirubin 0.8 0.3 - 1.2 mg/dL   GFR, Estimated >60 >60 mL/min   Anion gap 11 5 - 15  Type and screen   Collection Time: 06/11/22  8:12 AM  Result Value Ref Range   ABO/RH(D) A POS    Antibody Screen NEG    Sample Expiration      06/14/2022,2359 Performed at [facility], [address] [place], [place] [address]   Protein / creatinine ratio, urine   Collection Time: 06/11/22  9:03 AM  Result Value Ref Range   Creatinine, Urine 77.19 mg/dL   Total Protein, Urine 9 mg/dL   Protein Creatinine Ratio 0.12 0.00 - 0.15 mg/mg[Cre]  Results for orders placed or performed in visit on 06/10/22 (from the past 24 hour(s))  POC Urinalysis Dipstick OB   Collection Time: 06/10/22 11:12 AM  Result Value Ref Range   Color, UA     Clarity, UA     Glucose, UA Negative Negative   Bilirubin, UA     Ketones, UA neg    Spec Grav, UA     Blood, UA neg    pH, UA     POC,PROTEIN,UA Negative Negative, Trace, Small (1+), Moderate (2+), Large (3+), 4+   Urobilinogen, UA      Nitrite, UA neg    Leukocytes, UA Negative Negative   Appearance     Odor      Patient Active Problem List   Diagnosis Date Noted   Chronic hypertension affecting pregnancy 06/11/2022   AMA (advanced maternal age) multigravida 35+ 01/22/2022   Supervision of high-risk pregnancy 01/06/2022   Chronic hypertension 12/24/2021    Assessment/Plan:  Adilia Byrnes is a 36 y.o. G3P2002 at [redacted]w[redacted]d here for IOL for cHTN, no s/sx of PEC  #Labor: will begin IOL with buccal cytotec (47mcg) and ambulation #Pain: Wants to try unmedicated, amenable to epidural if needed #FWB: Cat 1 #ID:  GBS neg #MOF: formula #MOC: OCPs at pp visit #Circ:  N/A #cHTN: BP normal, PEC labs normal  Gabriel Carina, CNM  06/11/2022, 11:10 AM

## 2022-06-12 ENCOUNTER — Encounter (HOSPITAL_COMMUNITY): Payer: Self-pay | Admitting: Obstetrics & Gynecology

## 2022-06-12 MED ORDER — NIFEDIPINE ER 30 MG PO TB24
30.0000 mg | ORAL_TABLET | Freq: Every day | ORAL | 3 refills | Status: DC
Start: 2022-06-12 — End: 2022-10-13

## 2022-06-12 MED ORDER — FUROSEMIDE 20 MG PO TABS: 20.0000 mg | ORAL_TABLET | Freq: Every day | ORAL | 0 refills | Status: DC

## 2022-06-12 MED ORDER — IBUPROFEN 600 MG PO TABS: 600.0000 mg | ORAL_TABLET | Freq: Four times a day (QID) | ORAL | 0 refills | Status: DC | PRN

## 2022-06-12 NOTE — Anesthesia Postprocedure Evaluation (Signed)
Anesthesia Post Note  Patient: Kayla Fernandez  Procedure(s) Performed: AN AD HOC LABOR EPIDURAL     Patient location during evaluation: Mother Baby Anesthesia Type: Epidural Level of consciousness: awake Pain management: satisfactory to patient Vital Signs Assessment: post-procedure vital signs reviewed and stable Respiratory status: spontaneous breathing Cardiovascular status: stable Anesthetic complications: no   No notable events documented.  Last Vitals:  Vitals:   06/12/22 0633 06/12/22 1005  BP: 138/83 136/81  Pulse: 62 65  Resp: 17 19  Temp: 36.9 C 36.7 C  SpO2: 100% 99%    Last Pain:  Vitals:   06/12/22 1005  TempSrc: Oral  PainSc: 0-No pain   Pain Goal:                   [employer]

## 2022-06-14 ENCOUNTER — Other Ambulatory Visit: Payer: Medicaid Other

## 2022-06-17 ENCOUNTER — Ambulatory Visit (INDEPENDENT_AMBULATORY_CARE_PROVIDER_SITE_OTHER): Payer: Medicaid Other | Admitting: *Deleted

## 2022-06-17 DIAGNOSIS — O10919 Unspecified pre-existing hypertension complicating pregnancy, unspecified trimester: Secondary | ICD-10-CM

## 2022-06-17 NOTE — Progress Notes (Signed)
   NURSE VISIT- BLOOD PRESSURE CHECK  SUBJECTIVE:  Kayla Fernandez is a 36 y.o. G29P3003 female here for BP check. She is postpartum, delivery date 06/11/22     HYPERTENSION ROS:  Pregnant/postpartum:  Severe headaches that don't go away with tylenol/other medicines: No  Visual changes (seeing spots/double/blurred vision) No  Severe pain under right breast breast or in center of upper chest No  Severe nausea/vomiting No  Taking medicines as instructed yes    OBJECTIVE:  BP 136/84 (BP Location: Right Arm, Patient Position: Sitting, Cuff Size: Normal)   Pulse (!) 59   Breastfeeding No   Appearance alert, well appearing, and in no distress.  ASSESSMENT: Postpartum  blood pressure check  PLAN: Discussed with Dr. Despina Hidden   Recommendations: no changes needed   Follow-up: as scheduled   Annamarie Dawley  06/17/2022 10:23 AM

## 2022-06-18 ENCOUNTER — Encounter: Payer: Medicaid Other | Admitting: Obstetrics & Gynecology

## 2022-06-18 ENCOUNTER — Other Ambulatory Visit: Payer: Medicaid Other

## 2022-07-14 ENCOUNTER — Telehealth (INDEPENDENT_AMBULATORY_CARE_PROVIDER_SITE_OTHER): Payer: Medicaid Other | Admitting: Advanced Practice Midwife

## 2022-07-14 ENCOUNTER — Encounter: Payer: Self-pay | Admitting: Advanced Practice Midwife

## 2022-07-14 DIAGNOSIS — I10 Essential (primary) hypertension: Secondary | ICD-10-CM | POA: Diagnosis not present

## 2022-07-14 MED ORDER — SLYND 4 MG PO TABS: 1.0000 | ORAL_TABLET | Freq: Every day | ORAL | 11 refills | Status: DC

## 2022-07-14 NOTE — Progress Notes (Signed)
TELEHEALTH VIRTUAL POSTPARTUM VISIT ENCOUNTER NOTE Patient name: Kayla Fernandez MRN [id number]  Date of birth: 1986/09/16  I connected with patient on 07/14/22 at 10:10 AM EDT by MyChart and verified that I am speaking with the correct person using two identifiers. Pt is not currently in our office, she is at her home.  The provider is in the office.    I discussed the limitations, risks, security and privacy concerns of performing an evaluation and management service by telephone and the availability of in person appointments. I also discussed with the patient that there may be a patient responsible charge related to this service. The patient expressed understanding and agreed to proceed.  Chief Complaint:   Postpartum Care  History of Present Illness:   Kayla Fernandez is a 36 y.o. G19P3003 Caucasian female being seen today for a postpartum visit. She is  4.5  weeks postpartum following a spontaneous vaginal delivery at 39.0 gestational weeks. IOL: yes, for chronic hypertension . Anesthesia: epidural.  Laceration: small periurethral.  Complications: none. Inpatient contraception: no.   Pregnancy complicated by cHTN (on Labetalol 246m bid in preg); AMA . Tobacco use: no. Substance use disorder: no. Last pap smear: Aug 2020 and results were NILM w/ HRHPV negative. Next pap smear due: Aug 2023 No LMP recorded (exact date).  Postpartum course has been complicated by being sent home on ProcardiaXL 375m. Bleeding scant staining. Bowel function is normal. Bladder function is normal. Urinary incontinence? no, fecal incontinence? no Patient is not sexually active. Last sexual activity: prior to birth of baby. Desired contraception: POPs. Patient does not know about a pregnancy in the future.  Desired family size is unsure number of children.   Upstream - 07/14/22 1024       Pregnancy Intention Screening   Does the patient want to become pregnant in the next year? No    Does the patient's partner  want to become pregnant in the next year? No    Would the patient like to discuss contraceptive options today? No      Contraception Wrap Up   Current Method Abstinence    End Method Oral Contraceptive    Contraception Counseling Provided No            The pregnancy intention screening data noted above was reviewed. Potential methods of contraception were discussed. The patient elected to proceed with Oral Contraceptive.  Edinburgh Postpartum Depression Screening: negative  Edinburgh Postnatal Depression Scale - 07/14/22 1025       Edinburgh Postnatal Depression Scale:  In the Past 7 Days   I have been able to laugh and see the funny side of things. 0    I have looked forward with enjoyment to things. 0    I have blamed myself unnecessarily when things went wrong. 0    I have been anxious or worried for no good reason. 0    I have felt scared or panicky for no good reason. 0    Things have been getting on top of me. 0    I have been so unhappy that I have had difficulty sleeping. 0    I have felt sad or miserable. 0    I have been so unhappy that I have been crying. 0    The thought of harming myself has occurred to me. 0    Edinburgh Postnatal Depression Scale Total 0            Baby's course has been uncomplicated. Baby  is feeding by bottle. Infant has a pediatrician/family doctor? Yes.  Childcare strategy if returning to work/school: n/a-not working/going back to school.  Pt has material needs met for her and baby: Yes.   Review of Systems:   Pertinent items are noted in HPI Denies Abnormal vaginal discharge w/ itching/odor/irritation, headaches, visual changes, shortness of breath, chest pain, abdominal pain, severe nausea/vomiting, or problems with urination or bowel movements. Pertinent History Reviewed:  Reviewed past medical,surgical, obstetrical and family history.  Reviewed problem list, medications and allergies. OB History  Gravida Para Term Preterm AB Living   3 3 3     3   SAB IAB Ectopic Multiple Live Births        0 3    # Outcome Date GA Lbr Len/2nd Weight Sex Delivery Anes PTL Lv  3 Term 06/11/22 30w0d01:44 / 00:07 6 lb 1.7 oz (2.77 kg) F Vag-Spont EPI  LIV  2 Term 11/25/13 42w0d9:51 / 01:48 7 lb 5.5 oz (3.33 kg) F Vag-Spont EPI N LIV  1 Term 10/09/09 4072w0d lb 11 oz (3.033 kg) M Vag-Spont EPI N LIV   Physical Assessment:   Vitals:   07/14/22 1023  BP: (!) 151/95  There is no height or weight on file to calculate BMI.  Repeat BP: 133/80         Physical Examination:   General:  Alert, oriented and cooperative.   Mental Status: Normal mood and affect perceived.   Rest of physical exam deferred due to type of encounter       No results found for this or any previous visit (from the past 24 hour(s)).  Assessment & Plan:  1) Postpartum exam 2) Four and a half wks s/p spontaneous vaginal delivery 3) bottle feeding 4) Depression screening 5) Contraception management: desires oral contraceptives; rx Slynd to start when desires (use backup first pack) 6) cHTN, well-controlled on ProcardiaXL 48m58mssential components of care per ACOG recommendations:  1.  Mood and well being:  If positive depression screen, discussed and plan developed.  If using tobacco we discussed reduction/cessation and risk of relapse If current substance abuse, we discussed and referral to local resources was offered.   2. Infant care and feeding:  If breastfeeding, discussed returning to work, pumping, breastfeeding-associated pain, guidance regarding return to fertility while lactating if not using another method. If needed, patient was provided with a letter to be allowed to pump q 2-3hrs to support lactation in a private location with access to a refrigerator to store breastmilk.   Recommended that all caregivers be immunized for flu, pertussis and other preventable communicable diseases If pt does not have material needs met for her/baby, referred to  local resources for help obtaining these.  3. Sexuality, contraception and birth spacing Provided guidance regarding sexuality, management of dyspareunia, and resumption of intercourse Discussed avoiding interpregnancy interval <6mth69mnd recommended birth spacing of 18 months  4. Sleep and fatigue Discussed coping options for fatigue and sleep disruption Encouraged family/partner/community support of 4 hrs of uninterrupted sleep to help with mood and fatigue  5. Physical recovery  If pt had a C/S, assessed incisional pain and providing guidance on normal vs prolonged recovery If pt had a laceration, perineal healing and pain reviewed.  If urinary or fecal incontinence, discussed management and referred to PT or uro/gyn if indicated  Patient is safe to resume physical activity. Discussed attainment of healthy weight.  6.  Chronic disease management Discussed pregnancy complications if  any, and their implications for future childbearing and long-term maternal health. Review recommendations for prevention of recurrent pregnancy complications, such as 17 hydroxyprogesterone caproate to reduce risk for recurrent PTB not applicable, or aspirin to reduce risk of preeclampsia: yes. Pt had GDM: No. If yes, 2hr GTT scheduled: not applicable. Reviewed medications and non-pregnant dosing including consideration of whether pt is breastfeeding using a reliable resource such as LactMed: not applicable Referred for f/u w/ PCP or subspecialist providers as indicated: no  7. Health maintenance Mammogram at 36yo or earlier if indicated Pap smears as indicated  Meds:  Meds ordered this encounter  Medications   Drospirenone (SLYND) 4 MG TABS    Sig: Take 1 tablet by mouth daily.    Dispense:  28 tablet    Refill:  11    Order Specific Question:   Supervising Provider    Answer:   Janyth Pupa [[id number]]    Follow-up: Return for Pap & Physical, 1st available.   No orders of the defined types were  placed in this encounter.    I provided 10 minutes of non-face-to-face time during this encounter.  Myrtis Ser CNM 07/14/2022 11:19 AM

## 2022-08-05 ENCOUNTER — Encounter: Payer: Self-pay | Admitting: Obstetrics & Gynecology

## 2022-08-09 ENCOUNTER — Encounter: Payer: Self-pay | Admitting: Obstetrics & Gynecology

## 2022-08-30 ENCOUNTER — Ambulatory Visit: Payer: Medicaid Other | Admitting: Women's Health

## 2022-10-13 ENCOUNTER — Ambulatory Visit (INDEPENDENT_AMBULATORY_CARE_PROVIDER_SITE_OTHER): Payer: Medicaid Other | Admitting: Adult Health

## 2022-10-13 ENCOUNTER — Encounter: Payer: Self-pay | Admitting: Adult Health

## 2022-10-13 ENCOUNTER — Other Ambulatory Visit (HOSPITAL_COMMUNITY)
Admission: RE | Admit: 2022-10-13 | Discharge: 2022-10-13 | Disposition: A | Discharge status: Home / Self Care | Payer: Medicaid Other | Source: Ambulatory Visit | Attending: Women's Health | Admitting: Women's Health

## 2022-10-13 VITALS — BP 127/83 | HR 86 | Ht 67.0 in | Wt 176.5 lb

## 2022-10-13 DIAGNOSIS — Z0000 Encounter for general adult medical examination without abnormal findings: Secondary | ICD-10-CM

## 2022-10-13 DIAGNOSIS — Z3041 Encounter for surveillance of contraceptive pills: Secondary | ICD-10-CM

## 2022-10-13 DIAGNOSIS — Z01419 Encounter for gynecological examination (general) (routine) without abnormal findings: Secondary | ICD-10-CM | POA: Diagnosis not present

## 2022-10-13 NOTE — Progress Notes (Signed)
Patient ID: Kayla Fernandez, female   DOB: [birth year], 36 y.o.   MRN: [id number] History of Present Illness: Kayla Fernandez is a 36 tear  old white female, with SO, G3P3 in for a well woman gyn exam and pap. Sh is on Slynd and doing good, also uses condoms.    Current Medications, Allergies, Past Medical History, Past Surgical History, Family History and Social History were reviewed in [employer] record.     Review of Systems: Patient denies any headaches, hearing loss, fatigue, blurred vision, shortness of breath, chest pain, abdominal pain, problems with bowel movements, urination, or intercourse. No joint pain or mood swings.     Physical Exam:BP 127/83 (BP Location: Left Arm, Patient Position: Sitting, Cuff Size: Normal)   Pulse 86   Ht 5\' 7"  (1.702 m)   Wt 176 lb 8 oz (80.1 kg)   LMP 08/28/2022   Breastfeeding No   BMI 27.64 kg/m   General:  Well developed, well nourished, no acute distress Skin:  Warm and dry Neck:  Midline trachea, normal thyroid, good ROM, no lymphadenopathy Lungs; Clear to auscultation bilaterally Breast:  No dominant palpable mass, retraction, or nipple discharge Cardiovascular: Regular rate and rhythm Abdomen:  Soft, non tender, no hepatosplenomegaly Pelvic:  External genitalia is normal in appearance, no lesions.  The vagina is normal in appearance. Urethra has no lesions or masses. The cervix is bulbous.Pap with HR HPV genotyping performed.  Uterus is felt to be normal size, shape, and contour.  No adnexal masses or tenderness noted.Bladder is non tender, no masses felt. Extremities/musculoskeletal:  No swelling or varicosities noted, no clubbing or cyanosis Psych:  No mood changes, alert and cooperative,seems happy AA is 0 Fall risk is low  Upstream - 10/13/22 1127       Pregnancy Intention Screening   Does the patient want to become pregnant in the next year? No    Does the patient's partner want to become pregnant in the next year?  No    Would the patient like to discuss contraceptive options today? No      Contraception Wrap Up   Current Method Oral Contraceptive    End Method Oral Contraceptive                10/13/2022   11:28 AM 03/18/2022   10:41 AM 01/22/2022   10:35 AM  GAD 7 : Generalized Anxiety Score  Nervous, Anxious, on Edge 0 0 0  Control/stop worrying 0 0 0  Worry too much - different things 0 0 0  Trouble relaxing 0 0 0  Restless 0 0 0  Easily annoyed or irritable 0 0 0  Afraid - awful might happen 0 0 0  Total GAD 7 Score 0 0 0    Upstream - 10/13/22 1127       Pregnancy Intention Screening   Does the patient want to become pregnant in the next year? No    Does the patient's partner want to become pregnant in the next year? No    Would the patient like to discuss contraceptive options today? No      Contraception Wrap Up   Current Method Oral Contraceptive    End Method Oral Contraceptive              Examination chaperoned by Kayla Pupa LPN   Impression and Plan: 1. Routine general medical examination at a health care facility Pap sent Pap in 3 years if normal Physical in 1 year  2. Encounter for surveillance of contraceptive pills Continue Slynd, has refills   3. Encounter for gynecological examination with Papanicolaou smear of cervix Pap sent

## 2022-10-15 LAB — CYTOLOGY - PAP
Comment: NEGATIVE
Comment: NEGATIVE
Comment: NEGATIVE
Diagnosis: NEGATIVE
Diagnosis: REACTIVE
HPV 16: NEGATIVE
HPV 18 / 45: NEGATIVE
High risk HPV: POSITIVE — AB

## 2022-10-16 IMAGING — US US MFM UA CORD DOPPLER
1 series · 12 of 28 positions shown · non-contrast
Comparison: none

[Series 1: us mfm ua cord doppler · 44 acquisitions, 12 frames shown]
[im 2/44]
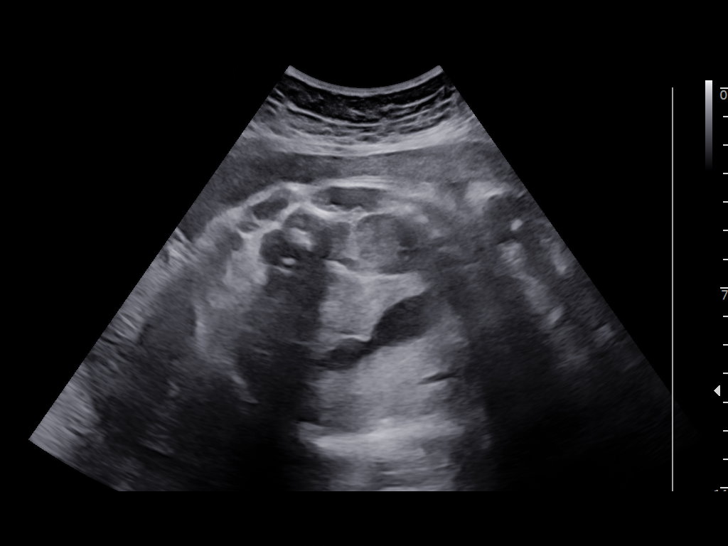
[im 5/44]
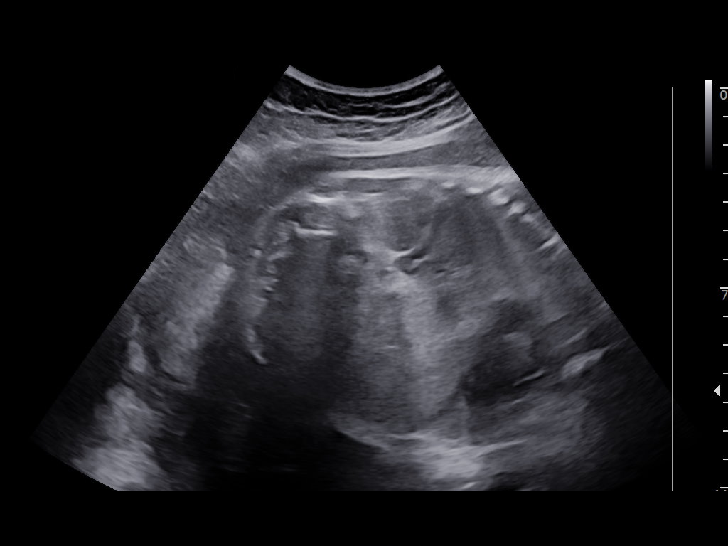
[im 8/44]
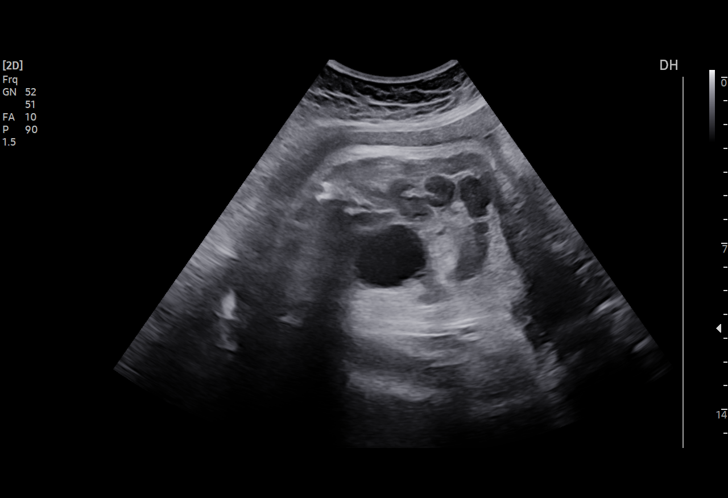
[im 13/44]
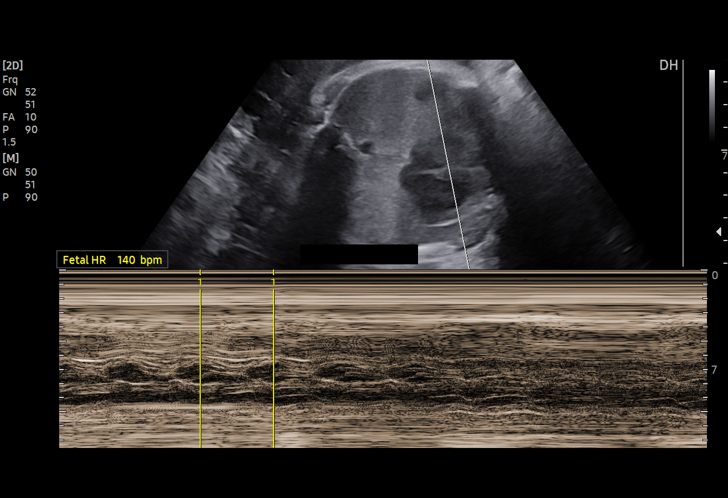
[im 16/44]
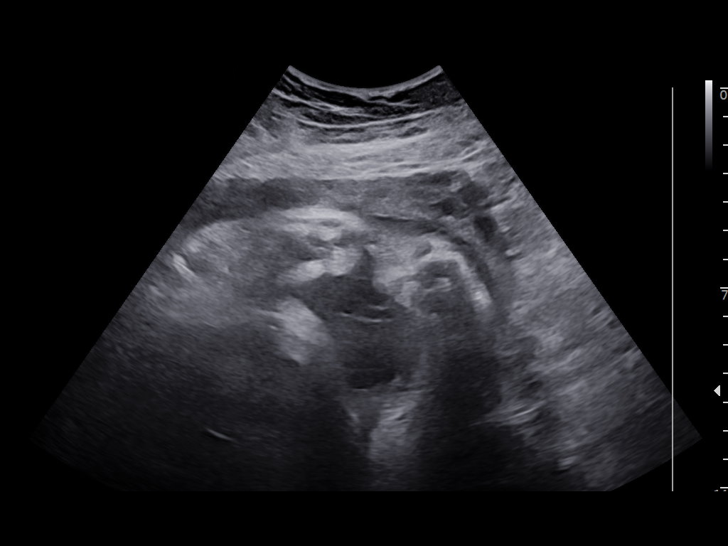
[im 20/44]
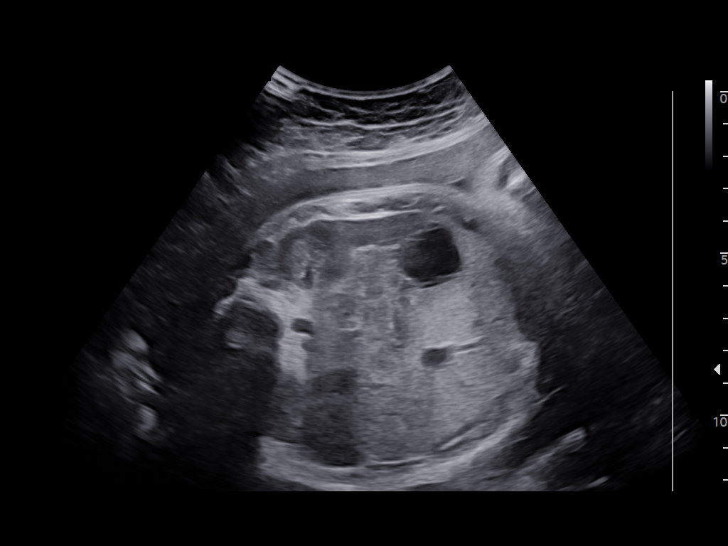
[im 24/44]
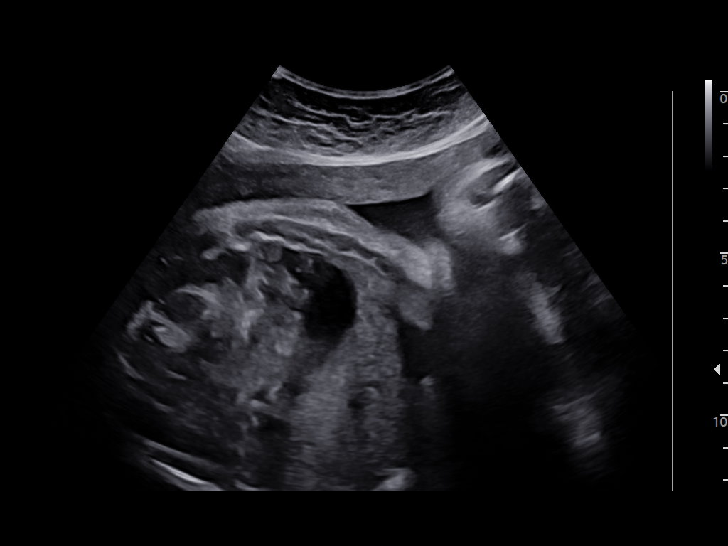
[im 28/44]
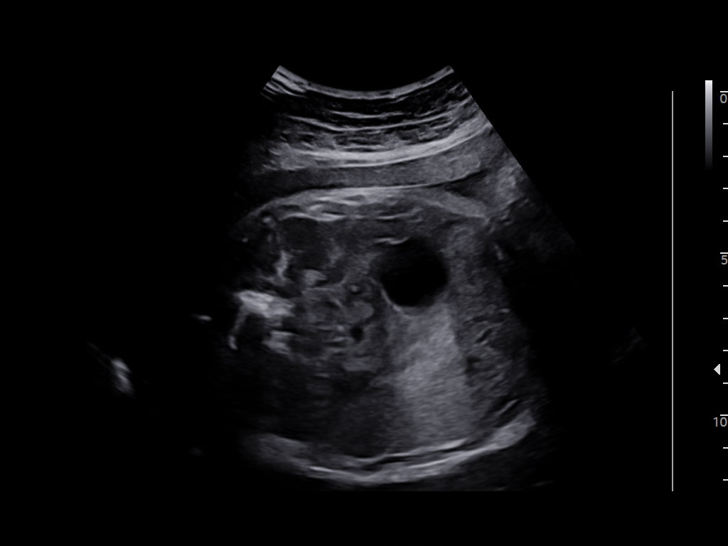
[im 31/44]
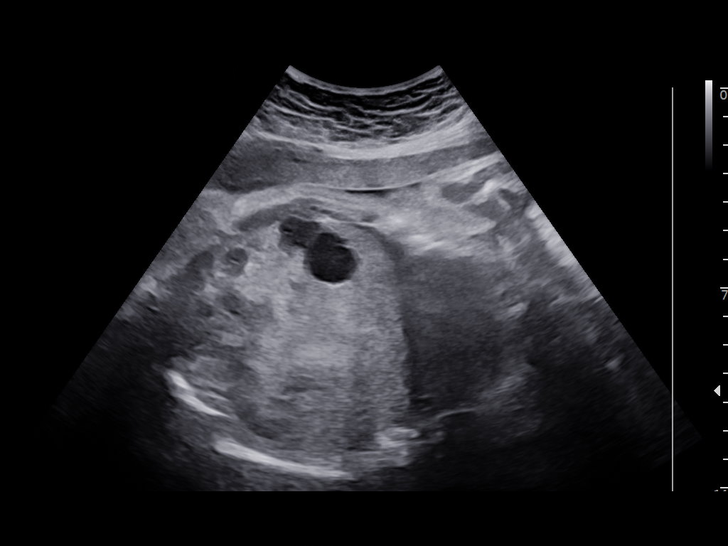
[im 36/44]
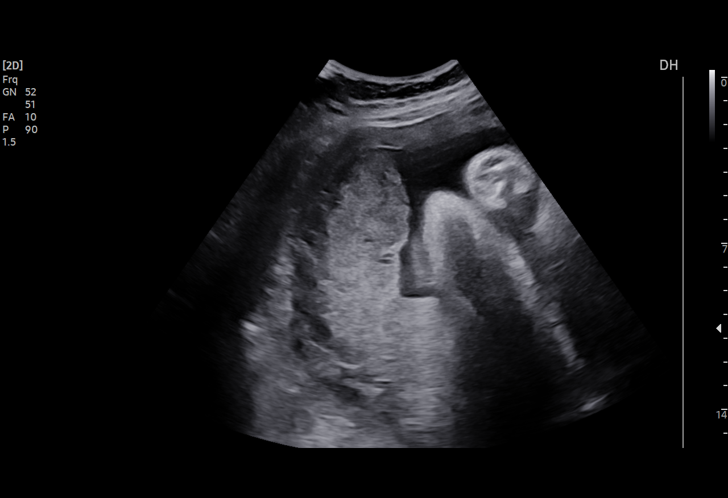
[im 39/44]
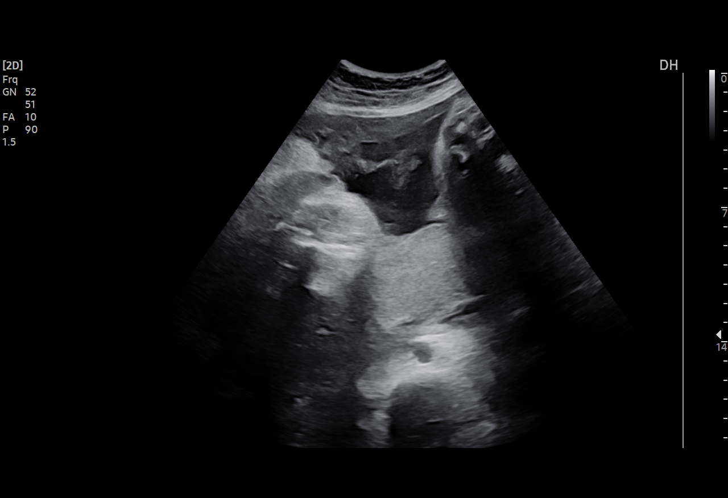
[im 42/44]
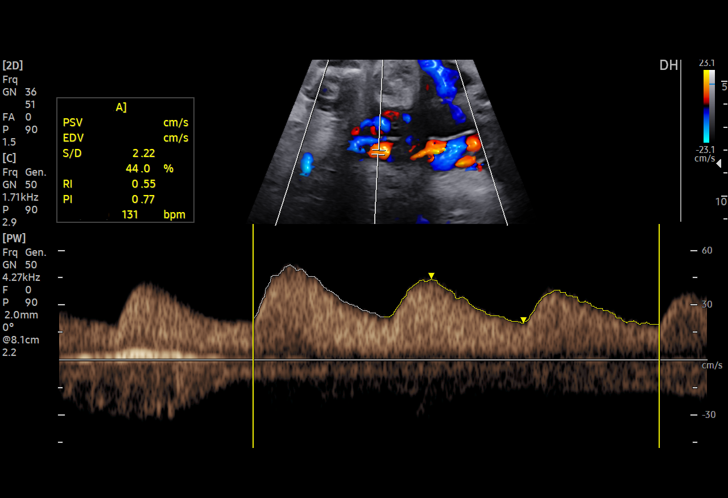

[12 of 28 positions shown; findings below may reference images not displayed]

Addendum:\.br----------------------------------------------------------------------

 1  US MFM UA CORD DOPPLER                76820.02    MOGAU NAMENG

Indications

 Obesity complicating prepregnancy, third
 trimester (BMI 31)
 Advanced maternal age (36) multigravida
 35+, third trimester
 Hypertension - Chronic/Pre-existing
 (labetalol)
 37 weeks gestation of pregnancy
 Encounter for antenatal screening for other
 genetic defects
Fetal Evaluation

 Num Of Fetuses:         1
 Fetal Heart Rate(bpm):  140
 Cardiac Activity:       Observed
 Presentation:           Cephalic
 Placenta:               Posterior
 P. Cord Insertion:      Not well visualized

 Amniotic Fluid
 AFI FV:      Within normal limits

 AFI Sum(cm)     %Tile       Largest Pocket(cm)
 9.52            22

 RUQ(cm)       RLQ(cm)       LUQ(cm)        LLQ(cm)

Biophysical Evaluation

 Amniotic F.V:   Pocket => 2 cm             F. Tone:        Observed
 F. Movement:    Observed                   Score:          [DATE]
 F. Breathing:   Observed
Biometry

 LV:        5.3  mm
OB History

 Gravidity:    3         Term:   2
 Living:       2
Gestational Age

 Best:          37w 6d     Det. By:  [facility]         EDD:   06/18/22
                                     (01/04/22)
Anatomy

 Ventricles:            Appears normal         Kidneys:                Appear normal
 Diaphragm:             Appears normal         Bladder:                Appears normal
 Stomach:               Appears normal, left
                        sided
Doppler - Fetal Vessels

 Umbilical Artery
  S/D     %tile      RI    %tile      PI    %tile     PSV    ADFV    RDFV
                                                    (cm/s)
  2.18       42    0.54       44    0.[REDACTED]      No      No

Cervix Uterus Adnexa

 Cervix
 Not adaquately visualized

 Uterus
 No abnormality visualized.

 Right Ovary
 Not visualized.

 Left Ovary
 Not visualized.

 Cul De Sac
 No free fluid seen.

 Adnexa
 No abnormality visualized.
Impression
 Chronic hypertension.  Patient takes labetalol for control.
 Blood pressure today at her office is 120/69 mmHg.
 Biophysical profile and umbilical artery Doppler study was
 requested by your office.

 Amniotic fluid is normal and good fetal activity seen.
 Cephalic presentation.  Umbilical artery Doppler showed
 normal forward diastolic flow.  Antenatal testing is reassuring.
 BPP [DATE].
 Fetal anatomical survey was not performed.
Recommendations

 Follow-up scans at your office.
                     Rokna, Xenitti

*** End of Addendum ***\.br----------------------------------------------------------------------

 1  US MFM UA CORD DOPPLER                76820.02    MOGAU NAMENG

Indications

 Obesity complicating prepregnancy, third
 trimester (BMI 31)
 Advanced maternal age (36) multigravida
 35+, third trimester
 Hypertension - Chronic/Pre-existing
 (labetalol)
 37 weeks gestation of pregnancy
 Encounter for antenatal screening for other
 genetic defects
Fetal Evaluation

 Num Of Fetuses:         1
 Fetal Heart Rate(bpm):  140
 Cardiac Activity:       Observed
 Presentation:           Cephalic
 Placenta:               Posterior
 P. Cord Insertion:      Not well visualized

 Amniotic Fluid
 AFI FV:      Within normal limits

 AFI Sum(cm)     %Tile       Largest Pocket(cm)
 9.52            22

 RUQ(cm)       RLQ(cm)       LUQ(cm)        LLQ(cm)

Biophysical Evaluation

 Amniotic F.V:   Pocket => 2 cm             F. Tone:        Observed
 F. Movement:    Observed                   Score:          [DATE]
 F. Breathing:   Observed
Biometry

 LV:        5.3  mm
OB History

 Gravidity:    3         Term:   2
 Living:       2
Gestational Age

 Best:          37w 6d     Det. By:  [facility]         EDD:   06/18/22
                                     (01/04/22)
Anatomy

 Ventricles:            Appears normal         Kidneys:                Appear normal
 Diaphragm:             Appears normal         Bladder:                Appears normal
 Stomach:               Appears normal, left
                        sided
Doppler - Fetal Vessels

 Umbilical Artery
  S/D     %tile      RI    %tile      PI    %tile     PSV    ADFV    RDFV
                                                    (cm/s)
  2.18       42    0.54       44    0.[REDACTED]      No      No

Cervix Uterus Adnexa

 Cervix
 Not adaquately visualized

 Uterus
 No abnormality visualized.

 Right Ovary
 Not visualized.

 Left Ovary
 Not visualized.

 Cul De Sac
 No free fluid seen.

 Adnexa
 No abnormality visualized.
Impression
 Chronic hypertension.  Patient takes labetalol for control.
 Blood pressure today at her office is 120/69 mmHg.
 Biophysical profile and umbilical artery Doppler study was
 requested by your office.

 Amniotic fluid is normal and good fetal activity seen.
 Cephalic presentation.  Umbilical artery Doppler showed
 normal forward diastolic flow.  Antenatal testing is reassuring.
 BPP [DATE].
 Fetal anatomical survey was not performed.
Recommendations

 Follow-up scans at your office.
                Rokna, Xenitti

## 2022-10-18 ENCOUNTER — Encounter: Payer: Self-pay | Admitting: Adult Health

## 2022-10-18 DIAGNOSIS — R87810 Cervical high risk human papillomavirus (HPV) DNA test positive: Secondary | ICD-10-CM | POA: Insufficient documentation

## 2023-06-20 ENCOUNTER — Other Ambulatory Visit: Payer: Self-pay | Admitting: Advanced Practice Midwife

## 2023-12-30 ENCOUNTER — Other Ambulatory Visit: Payer: Self-pay | Admitting: Advanced Practice Midwife

## 2023-12-30 ENCOUNTER — Other Ambulatory Visit: Payer: Self-pay | Admitting: Adult Health

## 2023-12-30 ENCOUNTER — Encounter: Payer: Self-pay | Admitting: Obstetrics & Gynecology

## 2023-12-30 MED ORDER — SLYND 4 MG PO TABS
1.0000 | ORAL_TABLET | Freq: Every day | ORAL | 1 refills | Status: DC
Start: 2023-12-30 — End: 2024-02-28

## 2023-12-30 NOTE — Progress Notes (Signed)
[facility] refilled x 1

## 2024-02-28 ENCOUNTER — Other Ambulatory Visit: Payer: Self-pay | Admitting: Adult Health

## 2024-04-27 ENCOUNTER — Other Ambulatory Visit: Payer: Self-pay | Admitting: Adult Health
